# Patient Record
Sex: Female | Born: 1955
Health system: Southern US, Community
[De-identification: ages and names within clinical notes are randomized; demographics above are authoritative.]

## PROBLEM LIST (undated history)

## (undated) DIAGNOSIS — R7303 Prediabetes: Secondary | ICD-10-CM

## (undated) DIAGNOSIS — H6980 Other specified disorders of Eustachian tube, unspecified ear: Secondary | ICD-10-CM

## (undated) DIAGNOSIS — C189 Malignant neoplasm of colon, unspecified: Secondary | ICD-10-CM

## (undated) DIAGNOSIS — T7840XA Allergy, unspecified, initial encounter: Secondary | ICD-10-CM

## (undated) DIAGNOSIS — E039 Hypothyroidism, unspecified: Secondary | ICD-10-CM

## (undated) DIAGNOSIS — E041 Nontoxic single thyroid nodule: Secondary | ICD-10-CM

## (undated) DIAGNOSIS — I82409 Acute embolism and thrombosis of unspecified deep veins of unspecified lower extremity: Secondary | ICD-10-CM

## (undated) DIAGNOSIS — Z0000 Encounter for general adult medical examination without abnormal findings: Secondary | ICD-10-CM

## (undated) DIAGNOSIS — K635 Polyp of colon: Secondary | ICD-10-CM

## (undated) DIAGNOSIS — E781 Pure hyperglyceridemia: Secondary | ICD-10-CM

## (undated) HISTORY — DX: Encounter for general adult medical examination without abnormal findings: Z00.00

## (undated) HISTORY — DX: Hypothyroidism, unspecified: E03.9

## (undated) HISTORY — DX: Acute embolism and thrombosis of unspecified deep veins of unspecified lower extremity: I82.409

## (undated) HISTORY — DX: Pure hyperglyceridemia: E78.1

## (undated) HISTORY — DX: Allergy, unspecified, initial encounter: T78.40XA

## (undated) HISTORY — DX: Polyp of colon: K63.5

## (undated) HISTORY — DX: Prediabetes: R73.03

## (undated) HISTORY — DX: Other specified disorders of Eustachian tube, unspecified ear: H69.80

## (undated) HISTORY — DX: Malignant neoplasm of colon, unspecified: C18.9

## (undated) HISTORY — DX: Nontoxic single thyroid nodule: E04.1

---
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
## 1988-05-21 HISTORY — PX: KNEE ARTHROSCOPY W/ ACL RECONSTRUCTION: SHX1858

## 1995-05-22 DIAGNOSIS — C189 Malignant neoplasm of colon, unspecified: Secondary | ICD-10-CM

## 1995-05-22 HISTORY — PX: COLON RESECTION: SHX5231

## 1995-05-22 HISTORY — DX: Malignant neoplasm of colon, unspecified: C18.9

## 1999-05-22 HISTORY — PX: TUBAL LIGATION: SHX77

## 2000-05-21 HISTORY — PX: HERNIA REPAIR: SHX51

## 2011-03-26 LAB — HM COLONOSCOPY

## 2011-05-22 LAB — HM COLONOSCOPY

## 2011-05-22 LAB — HM MAMMOGRAPHY: HM Mammogram: NEGATIVE

## 2012-02-19 LAB — HM PAP SMEAR: HM PAP: NEGATIVE

## 2012-02-29 DIAGNOSIS — R7303 Prediabetes: Secondary | ICD-10-CM

## 2012-02-29 DIAGNOSIS — E041 Nontoxic single thyroid nodule: Secondary | ICD-10-CM

## 2012-02-29 HISTORY — DX: Prediabetes: R73.03

## 2012-02-29 HISTORY — DX: Nontoxic single thyroid nodule: E04.1

## 2012-07-03 DIAGNOSIS — H6990 Unspecified Eustachian tube disorder, unspecified ear: Secondary | ICD-10-CM

## 2012-07-03 DIAGNOSIS — H6980 Other specified disorders of Eustachian tube, unspecified ear: Secondary | ICD-10-CM

## 2012-07-03 HISTORY — DX: Other specified disorders of Eustachian tube, unspecified ear: H69.80

## 2012-07-03 HISTORY — DX: Unspecified eustachian tube disorder, unspecified ear: H69.90

## 2012-08-23 LAB — HM MAMMOGRAPHY

## 2013-11-12 ENCOUNTER — Encounter: Payer: Self-pay | Admitting: Adult Health

## 2013-11-12 ENCOUNTER — Ambulatory Visit (INDEPENDENT_AMBULATORY_CARE_PROVIDER_SITE_OTHER): Payer: BC Managed Care – PPO | Admitting: Adult Health

## 2013-11-12 ENCOUNTER — Encounter (INDEPENDENT_AMBULATORY_CARE_PROVIDER_SITE_OTHER): Payer: Self-pay

## 2013-11-12 VITALS — BP 108/80 | HR 104 | Temp 98.3°F | Resp 14 | Ht 67.5 in | Wt 174.8 lb

## 2013-11-12 DIAGNOSIS — E781 Pure hyperglyceridemia: Secondary | ICD-10-CM

## 2013-11-12 DIAGNOSIS — E038 Other specified hypothyroidism: Secondary | ICD-10-CM | POA: Insufficient documentation

## 2013-11-12 DIAGNOSIS — Z789 Other specified health status: Secondary | ICD-10-CM

## 2013-11-12 DIAGNOSIS — Z13820 Encounter for screening for osteoporosis: Secondary | ICD-10-CM

## 2013-11-12 DIAGNOSIS — Z1283 Encounter for screening for malignant neoplasm of skin: Secondary | ICD-10-CM

## 2013-11-12 LAB — VITAMIN B12: VITAMIN B 12: 171 pg/mL — AB (ref 211–911)

## 2013-11-12 LAB — TSH: TSH: 0.54 u[IU]/mL (ref 0.35–4.50)

## 2013-11-12 LAB — VITAMIN D 25 HYDROXY (VIT D DEFICIENCY, FRACTURES): VITD: 31.08 ng/mL

## 2013-11-12 NOTE — Progress Notes (Signed)
Patient ID: Davida Falconi, female   DOB: 18-Apr-1956, 58 y.o.   MRN: [id number]   Subjective:    Patient ID: Lendon Ka, female    DOB: December 05, 1955, 58 y.o.   MRN: [id number]  HPI  Pt is a pleasant 58 y/o female who presents to clinic to establish care. She moved here from [place] in April. History of colon cancer s/p resection and chemotherapy (1997) at age 15. She had yearly colonoscopies for several years. Last colonoscopy was 2012 and advised to return to normal schedule of 10 years.  She has mammograms every 2 years. Last mammogram (02/2012). Last PAP and HPV were normal (03/06/12).  Pt has an itchy area on the center of her back that she would like evaluated. She reports years of sun exposure. Grew up in era with not sun protection. Has never had a full skin assessment.   Past Medical History  Diagnosis Date  . Colon cancer 1997    s/p resection, chemo  . DVT (deep venous thrombosis)     2/2 BCP  . Hypothyroidism     On Armour. Did not tolerate synthroid     Past Surgical History  Procedure Laterality Date  . Tubal ligation  2001  . Knee arthroscopy w/ acl reconstruction Left 1990  . Colon resection  1997  . Tonsillectomy and adenoidectomy  1962     Family History  Problem Relation Age of Onset  . Hyperlipidemia Brother      History   Social History  . Marital Status: Single    Spouse Name: N/A    Number of Children: 0  . Years of Education: 73   Occupational History  . Sales & Marketing     Retired   Social History Main Topics  . Smoking status: Never Smoker   . Smokeless tobacco: Never Used  . Alcohol Use: Yes     Comment: 1 drink weekly  . Drug Use: No  . Sexual Activity: Not on file   Other Topics Concern  . Not on file   Social History Narrative   Living in [place]. Divorced. Moved from [place] to this area in April 2015. She has an Loss adjuster, chartered. Retired from Tour manager.      Hobbies: cross country ski, gardening, travel, reading,  sewing   Caffeine: tea 2 cups daily   Exercise: [employer]. Workouts 4x/week - cardio, weight training   Diet: pescetarian    Review of Systems  Constitutional: Negative.   HENT: Negative.   Eyes: Negative.   Respiratory: Negative.   Cardiovascular: Negative.   Gastrointestinal: Negative.   Endocrine: Negative.   Genitourinary: Negative.   Musculoskeletal: Negative.   Skin:       Area on the center of upper back that is very itchy.  Allergic/Immunologic: Negative.   Neurological: Negative.   Hematological: Negative.   Psychiatric/Behavioral: Negative.        Objective:  BP 108/80  Pulse 104  Temp(Src) 98.3 F (36.8 C) (Oral)  Resp 14  Ht 5' 7.5" (1.715 m)  Wt 174 lb 12 oz (79.266 kg)  BMI 26.95 kg/m2  SpO2 98%   Physical Exam  Constitutional: She is oriented to person, place, and time. No distress.  HENT:  Head: Normocephalic and atraumatic.  Eyes: Conjunctivae and EOM are normal.  Neck: Normal range of motion. Neck supple.  Cardiovascular: Normal rate, regular rhythm, normal heart sounds and intact distal pulses.  Exam reveals no gallop and no friction rub.   No  murmur heard. Pulmonary/Chest: Effort normal and breath sounds normal. No respiratory distress. She has no wheezes. She has no rales.  Musculoskeletal: Normal range of motion.  Neurological: She is alert and oriented to person, place, and time. She has normal reflexes. Coordination normal.  Skin: Skin is warm and dry.  comedone - center of back. Several dark nevi. Hx of sun exposure without use of sunblock.  Psychiatric: She has a normal mood and affect. Her behavior is normal. Judgment and thought content normal.      Assessment & Plan:   1. Other specified hypothyroidism Check level. Refill Armour 30 mg daily. Follow - TSH  2. Hypertriglyceridemia w/o hypercholesterolemia Check labs. Hx of hypertriglyceridemia. Follow. - Lipid panel; Future  3. Screening for osteoporosis Ordered Dexa scan - she  would like this done in August. Check vitamin D level. - Vit D  25 hydroxy (rtn osteoporosis monitoring) - DG Bone Density; Future  4. Vegetarian diet Will check B12 level. Pesceterian. Only has fish maybe once weekly. She does eats eaggs - Vitamin B12  5. Screening for skin cancer Removal of comedone which was causing itching. Refer to derm for full skin assessment. She has several nevi that are very dark in color. Hx of sun exposure with no use of sunblock. - Ambulatory referral to Dermatology

## 2013-11-12 NOTE — Patient Instructions (Signed)
   Thank you for choosing [place] [facility] for your health care needs.  Please have your labs drawn prior to leaving the office.  The results will be available through MyChart for your convenience. Please remember to activate this. The activation code is located at the end of this form. If this has not been activated, we will call you with the results once I have reviewed them.  Prescription for Armour 30 mg daily #90, refill 3. If you need me to send this to Mail Order Pharmacy please call the office and advise of where to send.  I am referring you to [facility]. The office will contact you with an appointment.  I am ordering a Dexa scan. Will try to get this scheduled the first week of August per your request.  Please call with any questions or concerns.

## 2013-11-12 NOTE — Progress Notes (Signed)
Pre visit review using our clinic review tool, if applicable. No additional management support is needed unless otherwise documented below in the visit note. 

## 2013-11-13 ENCOUNTER — Encounter: Payer: Self-pay | Admitting: Adult Health

## 2013-11-13 ENCOUNTER — Encounter: Payer: Self-pay | Admitting: *Deleted

## 2013-11-13 MED ORDER — THYROID 30 MG PO TABS: 30.0000 mg | ORAL_TABLET | Freq: Every day | ORAL | Status: DC

## 2013-11-15 ENCOUNTER — Encounter: Payer: Self-pay | Admitting: Adult Health

## 2013-11-16 ENCOUNTER — Ambulatory Visit (INDEPENDENT_AMBULATORY_CARE_PROVIDER_SITE_OTHER): Payer: BC Managed Care – PPO | Admitting: *Deleted

## 2013-11-16 ENCOUNTER — Encounter: Payer: Self-pay | Admitting: Adult Health

## 2013-11-16 DIAGNOSIS — E538 Deficiency of other specified B group vitamins: Secondary | ICD-10-CM

## 2013-11-16 MED ORDER — CYANOCOBALAMIN 1000 MCG/ML IJ SOLN
1000.0000 ug | Freq: Once | INTRAMUSCULAR | Status: AC
  Administered 2013-11-16: 1000 ug via INTRAMUSCULAR

## 2013-11-16 NOTE — Telephone Encounter (Signed)
Pt id read mychart message, b12 appt scheduled for today.

## 2013-11-18 ENCOUNTER — Telehealth: Payer: Self-pay | Admitting: *Deleted

## 2013-11-18 ENCOUNTER — Ambulatory Visit (INDEPENDENT_AMBULATORY_CARE_PROVIDER_SITE_OTHER): Payer: BC Managed Care – PPO | Admitting: *Deleted

## 2013-11-18 DIAGNOSIS — E538 Deficiency of other specified B group vitamins: Secondary | ICD-10-CM

## 2013-11-18 MED ORDER — CYANOCOBALAMIN 1000 MCG/ML IJ SOLN
1000.0000 ug | Freq: Once | INTRAMUSCULAR | Status: AC
  Administered 2013-11-18: 1000 ug via INTRAMUSCULAR

## 2013-11-18 NOTE — Telephone Encounter (Signed)
Pt in for her B12 injection today. States she saw B12 patches at the pharmacy. Are you familiar with those and would recommend?

## 2013-11-19 NOTE — Telephone Encounter (Signed)
Called patient, no answer. Will try again later.

## 2013-11-19 NOTE — Telephone Encounter (Signed)
I would recommend she continue injections. Once we get her level up, she can then take over the counter pills. I have not heard of the B12 patches and their effectiveness.

## 2013-12-03 NOTE — Telephone Encounter (Signed)
Sent my chart message with information.

## 2013-12-22 ENCOUNTER — Ambulatory Visit: Payer: BC Managed Care – PPO

## 2013-12-22 ENCOUNTER — Ambulatory Visit (INDEPENDENT_AMBULATORY_CARE_PROVIDER_SITE_OTHER): Payer: BC Managed Care – PPO | Admitting: *Deleted

## 2013-12-22 DIAGNOSIS — E538 Deficiency of other specified B group vitamins: Secondary | ICD-10-CM

## 2013-12-22 MED ORDER — CYANOCOBALAMIN 1000 MCG/ML IJ SOLN
1000.0000 ug | Freq: Once | INTRAMUSCULAR | Status: AC
  Administered 2013-12-22: 1000 ug via INTRAMUSCULAR

## 2013-12-29 ENCOUNTER — Ambulatory Visit (INDEPENDENT_AMBULATORY_CARE_PROVIDER_SITE_OTHER): Payer: BC Managed Care – PPO | Admitting: *Deleted

## 2013-12-29 DIAGNOSIS — E538 Deficiency of other specified B group vitamins: Secondary | ICD-10-CM

## 2013-12-29 MED ORDER — CYANOCOBALAMIN 1000 MCG/ML IJ SOLN
1000.0000 ug | Freq: Once | INTRAMUSCULAR | Status: AC
  Administered 2013-12-29: 1000 ug via INTRAMUSCULAR

## 2013-12-30 ENCOUNTER — Ambulatory Visit (INDEPENDENT_AMBULATORY_CARE_PROVIDER_SITE_OTHER): Payer: BC Managed Care – PPO | Admitting: Internal Medicine

## 2013-12-30 ENCOUNTER — Encounter: Payer: Self-pay | Admitting: Internal Medicine

## 2013-12-30 VITALS — BP 138/98 | HR 105 | Temp 98.3°F | Resp 18 | Ht 67.5 in | Wt 177.8 lb

## 2013-12-30 DIAGNOSIS — K909 Intestinal malabsorption, unspecified: Secondary | ICD-10-CM

## 2013-12-30 DIAGNOSIS — E781 Pure hyperglyceridemia: Secondary | ICD-10-CM

## 2013-12-30 DIAGNOSIS — E038 Other specified hypothyroidism: Secondary | ICD-10-CM

## 2013-12-30 DIAGNOSIS — K9089 Other intestinal malabsorption: Secondary | ICD-10-CM

## 2013-12-30 DIAGNOSIS — Z13820 Encounter for screening for osteoporosis: Secondary | ICD-10-CM

## 2013-12-30 DIAGNOSIS — E039 Hypothyroidism, unspecified: Secondary | ICD-10-CM

## 2013-12-30 NOTE — Patient Instructions (Signed)
Repeating thyroid studies today or with fasting lipids  Try Joseph's flat bread and pita bread  For low carb bread choices  The B12 is best supplemented with the quick dissolve or sublingual tablets.   I recommend getting the majority of your calcium and Vitamin D  through diet rather than supplements given the recent association of calcium supplements with increased coronary artery calcium scores (You need 1200 mg daily )   Unsweetened almond/coconut milk is a great low calorie low carb, cholesterol free  way to increase your dietary calcium and vitamin D.  Try the blue Jackquline Bosch

## 2013-12-30 NOTE — Progress Notes (Signed)
Pre-visit discussion using our clinic review tool. No additional management support is needed unless otherwise documented below in the visit note.  

## 2013-12-30 NOTE — Progress Notes (Signed)
Patient ID: Carolyn Bass, female   DOB: Jun 09, 1955, 58 y.o.   MRN: [id number]  Patient Active Problem List   Diagnosis Date Noted  . Steatorrhea 12/31/2013  . Other specified hypothyroidism 11/12/2013  . Screening for osteoporosis 11/12/2013  . Vegetarian diet 11/12/2013  . Screening for skin cancer 11/12/2013  . Hypertriglyceridemia without hypercholesterolemia 11/12/2013    Subjective:  CC:   Chief Complaint  Patient presents with  . Follow-up    talk about nutrition    HPI:   Carolyn Bass is a 58 y.o. female who presents for Follow up on multiple issues.  iatrogenic malabsorption secondary to History of colon resection in 1998 for stage 3 colon CA.    History of resection of 1/3 of large intestine and 12 inches of small intestine complicated by a surgical hernia with mesh.  Has had   chronic diarrhea originally  treated with opiates.  Transitioned to immodium,  But continues to have loose stools post prandially and .  Self diagnosed steatorrhea and bile acid diarrhea..  Complicated by vegetarian lifestyle and b12 deficiency.  No prior trial of Questran.   Thyroid problems.  Has been taking Armour thyroid for years,  Previously on a compounded Armour product no longer available.  Prior trial of levothyroxine years ago not tolerated due to developmentent of brittle nails,  hair loss and excessive fatigue.  Recent TSH was 0.6  No tremor or unintentional wt loss.  Does note exercise intolerance which has become chronic , formerly very physically active .    Past Medical History  Diagnosis Date  . Colon cancer 1997    s/p resection, chemo  . DVT (deep venous thrombosis)     2/2 BCP  . Hypothyroidism     On Armour. Did not tolerate synthroid  . Prediabetes 02/29/12  . Thyroid nodule 02/29/12  . Eustachian tube dysfunction 07/03/12  . Hypertriglyceridemia without hypercholesterolemia     Past Surgical History  Procedure Laterality Date  . Tubal ligation  2001  . Knee  arthroscopy w/ acl reconstruction Left 1990  . Colon resection  1997  . Tonsillectomy and adenoidectomy  1962  . Hernia repair  2002       The following portions of the patient's history were reviewed and updated as appropriate: Allergies, current medications, and problem list.    Review of Systems:   Patient denies headache, fevers, malaise, unintentional weight loss, skin rash, eye pain, sinus congestion and sinus pain, sore throat, dysphagia,  hemoptysis , cough, dyspnea, wheezing, chest pain, palpitations, orthopnea, edema, abdominal pain, nausea, melena, diarrhea, constipation, flank pain, dysuria, hematuria, urinary  Frequency, nocturia, numbness, tingling, seizures,  Focal weakness, Loss of consciousness,  Tremor, insomnia, depression, anxiety, and suicidal ideation.     History   Social History  . Marital Status: Single    Spouse Name: N/A    Number of Children: 0  . Years of Education: 27   Occupational History  . Sales & Marketing     Retired   Social History Main Topics  . Smoking status: Never Smoker   . Smokeless tobacco: Never Used  . Alcohol Use: Yes     Comment: 1 drink weekly  . Drug Use: No  . Sexual Activity: Not on file   Other Topics Concern  . Not on file   Social History Narrative   Living in [place]. Divorced. Moved from [place] to this area in April 2015. She has an Loss adjuster, chartered. Retired from Tour manager.  Hobbies: cross country ski, gardening, travel, reading, sewing   Caffeine: tea 2 cups daily   Exercise: [employer]. Workouts 4x/week - cardio, weight training   Diet: pescetarian    Objective:  Filed Vitals:   12/30/13 1559  BP: 138/98  Pulse: 105  Temp: 98.3 F (36.8 C)  Resp: 18     General appearance: alert, cooperative and appears stated age Ears: normal TM's and external ear canals both ears Throat: lips, mucosa, and tongue normal; teeth and gums normal Neck: no adenopathy, no carotid bruit, supple, symmetrical,  trachea midline and thyroid not enlarged, symmetric, no tenderness/mass/nodules Back: symmetric, no curvature. ROM normal. No CVA tenderness. Lungs: clear to auscultation bilaterally Heart: regular rate and rhythm, S1, S2 normal, no murmur, click, rub or gallop Abdomen: soft, non-tender; bowel sounds normal; no masses,  no organomegaly Pulses: 2+ and symmetric Skin: Skin color, texture, turgor normal. No rashes or lesions Lymph nodes: Cervical, supraclavicular, and axillary nodes normal.  Assessment and Plan:  Steatorrhea Iatrogenic, secondary to bowel resection.  Trial of Questran.   Hypertriglyceridemia without hypercholesterolemia Will return for fasting lipids.   Other specified hypothyroidism Will repeat TSH with Free T4 and adjust Armour to achieve TSH 1.0  Screening for osteoporosis She is at increased risk fdue to malabsorption.  DEXA has been ordered.  Discussed the current controversies surrounding the risks and benefits of calcium supplementation.  Encouraged her to increase dietary calcium through natural foods including almond/coconut milk  A total of 40 minutes was spent with patient more than half of which was spent in counseling patient on the above mentioned issues , reviewing and explaining recent labs ,  and coordination of care.   Updated Medication List Outpatient Encounter Prescriptions as of 12/30/2013  Medication Sig  . thyroid (ARMOUR) 30 MG tablet Take 1 tablet (30 mg total) by mouth daily before breakfast.  . cholestyramine light (PREVALITE) 4 GM/DOSE powder Take 1 packet (4 g total) by mouth 3 (three) times daily with meals.  . Nutritional Supplements (ESTROVEN PO) Take 1 tablet by mouth 2 (two) times daily.     Orders Placed This Encounter  Procedures  . T4 AND TSH    No Follow-up on file.

## 2013-12-31 ENCOUNTER — Encounter: Payer: Self-pay | Admitting: Adult Health

## 2013-12-31 ENCOUNTER — Other Ambulatory Visit: Payer: Self-pay | Admitting: *Deleted

## 2013-12-31 ENCOUNTER — Other Ambulatory Visit (INDEPENDENT_AMBULATORY_CARE_PROVIDER_SITE_OTHER): Payer: BC Managed Care – PPO

## 2013-12-31 DIAGNOSIS — K909 Intestinal malabsorption, unspecified: Secondary | ICD-10-CM

## 2013-12-31 DIAGNOSIS — E781 Pure hyperglyceridemia: Secondary | ICD-10-CM

## 2013-12-31 DIAGNOSIS — E039 Hypothyroidism, unspecified: Secondary | ICD-10-CM

## 2013-12-31 LAB — LIPID PANEL
Cholesterol: 152 mg/dL (ref 0–200)
HDL: 44 mg/dL (ref >39.00)
LDL CALC: 68 mg/dL (ref 0–99)
NonHDL: 108
TRIGLYCERIDES: 199 mg/dL — AB (ref 0.0–149.0)
Total CHOL/HDL Ratio: 3
VLDL: 39.8 mg/dL (ref 0.0–40.0)

## 2013-12-31 MED ORDER — CHOLESTYRAMINE LIGHT 4 GM/DOSE PO POWD: 4.0000 g | Freq: Three times a day (TID) | ORAL | Status: DC

## 2013-12-31 NOTE — Assessment & Plan Note (Signed)
Will repeat TSH with Free T4 and adjust Armour to achieve TSH 1.0

## 2013-12-31 NOTE — Assessment & Plan Note (Signed)
Will return for fasting lipids.

## 2013-12-31 NOTE — Assessment & Plan Note (Signed)
Iatrogenic, secondary to bowel resection.  Trial of Questran.

## 2013-12-31 NOTE — Assessment & Plan Note (Signed)
She is at increased risk fdue to malabsorption.  DEXA has been ordered.  Discussed the current controversies surrounding the risks and benefits of calcium supplementation.  Encouraged her to increase dietary calcium through natural foods including almond/coconut milk

## 2014-01-01 LAB — T4 AND TSH
T4, Total: 7.9 ug/dL (ref 4.5–12.0)
TSH: 1.99 u[IU]/mL (ref 0.450–4.500)

## 2014-01-02 ENCOUNTER — Encounter: Payer: Self-pay | Admitting: Internal Medicine

## 2014-01-05 ENCOUNTER — Ambulatory Visit (INDEPENDENT_AMBULATORY_CARE_PROVIDER_SITE_OTHER): Payer: BC Managed Care – PPO | Admitting: *Deleted

## 2014-01-05 DIAGNOSIS — E538 Deficiency of other specified B group vitamins: Secondary | ICD-10-CM

## 2014-01-05 MED ORDER — CYANOCOBALAMIN 1000 MCG/ML IJ SOLN
1000.0000 ug | Freq: Once | INTRAMUSCULAR | Status: AC
  Administered 2014-01-05: 1000 ug via INTRAMUSCULAR

## 2014-02-09 ENCOUNTER — Ambulatory Visit (INDEPENDENT_AMBULATORY_CARE_PROVIDER_SITE_OTHER): Payer: BC Managed Care – PPO

## 2014-02-09 DIAGNOSIS — E538 Deficiency of other specified B group vitamins: Secondary | ICD-10-CM

## 2014-02-09 MED ORDER — CYANOCOBALAMIN 1000 MCG/ML IJ SOLN
1000.0000 ug | Freq: Once | INTRAMUSCULAR | Status: AC
  Administered 2014-02-09: 1000 ug via INTRAMUSCULAR

## 2014-04-23 ENCOUNTER — Encounter: Payer: Self-pay | Admitting: Internal Medicine

## 2014-04-23 ENCOUNTER — Ambulatory Visit (INDEPENDENT_AMBULATORY_CARE_PROVIDER_SITE_OTHER): Payer: BC Managed Care – PPO | Admitting: Internal Medicine

## 2014-04-23 VITALS — BP 138/84 | HR 98 | Temp 98.6°F | Resp 18 | Wt 172.5 lb

## 2014-04-23 DIAGNOSIS — J208 Acute bronchitis due to other specified organisms: Secondary | ICD-10-CM

## 2014-04-23 MED ORDER — ALBUTEROL SULFATE HFA 108 (90 BASE) MCG/ACT IN AERS
2.0000 | INHALATION_SPRAY | Freq: Four times a day (QID) | RESPIRATORY_TRACT | Status: DC | PRN
Start: 2014-04-23 — End: 2014-12-28

## 2014-04-23 MED ORDER — ACETAMINOPHEN-CODEINE #3 300-30 MG PO TABS: 1.0000 | ORAL_TABLET | ORAL | Status: DC | PRN

## 2014-04-23 MED ORDER — LEVOFLOXACIN 500 MG PO TABS: 500.0000 mg | ORAL_TABLET | Freq: Every day | ORAL | Status: DC

## 2014-04-23 MED ORDER — PREDNISONE (PAK) 10 MG PO TABS: ORAL_TABLET | ORAL | Status: DC

## 2014-04-23 NOTE — Progress Notes (Signed)
Patient ID: Carolyn Bass, female   DOB: [birth year], 58 y.o.   MRN: [id number]    Patient Active Problem List   Diagnosis Date Noted  . Acute bronchitis 04/24/2014  . Steatorrhea 12/31/2013  . Other specified hypothyroidism 11/12/2013  . Screening for osteoporosis 11/12/2013  . Vegetarian diet 11/12/2013  . Screening for skin cancer 11/12/2013  . Hypertriglyceridemia without hypercholesterolemia 11/12/2013    Subjective:  CC:   Chief Complaint  Patient presents with  . Cough    X 3 weeks, first week cough with yellow mucus was prescribed kelflex 500mg  for tooth extraction just finishe 10 day course on 12/  . Fever    Low grade fevr . still waking with sweats, and a terrible cough.    HPI:   Carolyn Bass is a 58 y.o. female who presents for  Persistent nonproductive cough which started 3 weeks ago .  Symptoms started three weeks ago  After having several sick contacts including an aunt with pneumonia.  Cough started with some mucus production.  Started taking keflex 500 mg qid 10 days ago had it for tooth extraction) prescribed by out of town MD in [place] when she developed  fevers to 101.  Fevers and sputum have resolved but still coughing paroxysmally  And still having  night sweats   Has had TDap vaccine  ']    Past Medical History  Diagnosis Date  . Colon cancer 1997    s/p resection, chemo  . DVT (deep venous thrombosis)     2/2 BCP  . Hypothyroidism     On Armour. Did not tolerate synthroid  . Prediabetes 02/29/12  . Thyroid nodule 02/29/12  . Eustachian tube dysfunction 07/03/12  . Hypertriglyceridemia without hypercholesterolemia     Past Surgical History  Procedure Laterality Date  . Tubal ligation  2001  . Knee arthroscopy w/ acl reconstruction Left 1990  . Colon resection  1997  . Tonsillectomy and adenoidectomy  1962  . Hernia repair  2002       The following portions of the patient's history were reviewed and updated as appropriate:  Allergies, current medications, and problem list.    Review of Systems:   Patient denies headache, fevers, malaise, unintentional weight loss, skin rash, eye pain, sinus congestion and sinus pain, sore throat, dysphagia,  hemoptysis , cough, dyspnea, wheezing, chest pain, palpitations, orthopnea, edema, abdominal pain, nausea, melena, diarrhea, constipation, flank pain, dysuria, hematuria, urinary  Frequency, nocturia, numbness, tingling, seizures,  Focal weakness, Loss of consciousness,  Tremor, insomnia, depression, anxiety, and suicidal ideation.     History   Social History  . Marital Status: Single    Spouse Name: N/A    Number of Children: 0  . Years of Education: 41   Occupational History  . Sales & Marketing     Retired   Social History Main Topics  . Smoking status: Never Smoker   . Smokeless tobacco: Never Used  . Alcohol Use: Yes     Comment: 1 drink weekly  . Drug Use: No  . Sexual Activity: Not on file   Other Topics Concern  . Not on file   Social History Narrative   Living in [place]. Divorced. Moved from [place] to this area in April 2015. She has an Loss adjuster, chartered. Retired from Tour manager.      Hobbies: cross country ski, gardening, travel, reading, sewing   Caffeine: tea 2 cups daily   Exercise: [employer]. Workouts 4x/week - cardio, weight  training   Diet: pescetarian    Objective:  Filed Vitals:   04/23/14 0857  BP: 138/84  Pulse: 98  Temp: 98.6 F (37 C)  Resp: 18     General appearance: alert, cooperative and appears stated age Ears: normal TM's and external ear canals both ears Throat: lips, mucosa, and tongue normal; teeth and gums normal Neck: no adenopathy, no carotid bruit, supple, symmetrical, trachea midline and thyroid not enlarged, symmetric, no tenderness/mass/nodules Back: symmetric, no curvature. ROM normal. No CVA tenderness. Lungs: clear to auscultation bilaterally Heart: regular rate and rhythm, S1, S2 normal, no  murmur, click, rub or gallop Abdomen: soft, non-tender; bowel sounds normal; no masses,  no organomegaly Pulses: 2+ and symmetric Skin: Skin color, texture, turgor normal. No rashes or lesions Lymph nodes: Cervical, supraclavicular, and axillary nodes normal.  Assessment and Plan:  Acute bronchitis Treating with steroids and inhaled  B/d therapy. Adding levaquin if no improvement in 3-4 days.    Updated Medication List Outpatient Encounter Prescriptions as of 04/23/2014  Medication Sig  . cholestyramine light (PREVALITE) 4 GM/DOSE powder Take 1 packet (4 g total) by mouth 3 (three) times daily with meals.  . Nutritional Supplements (ESTROVEN PO) Take 1 tablet by mouth 2 (two) times daily.  Marland Kitchen thyroid (ARMOUR) 30 MG tablet Take 1 tablet (30 mg total) by mouth daily before breakfast.  . acetaminophen-codeine (TYLENOL #3) 300-30 MG per tablet Take 1 tablet by mouth every 4 (four) hours as needed for moderate pain (cough).  Marland Kitchen albuterol (PROVENTIL HFA;VENTOLIN HFA) 108 (90 BASE) MCG/ACT inhaler Inhale 2 puffs into the lungs every 6 (six) hours as needed for wheezing or shortness of breath.  . levofloxacin (LEVAQUIN) 500 MG tablet Take 1 tablet (500 mg total) by mouth daily.  . predniSONE (STERAPRED UNI-PAK) 10 MG tablet 6 tablets on Day 1 , then reduce by 1 tablet daily until gone     No orders of the defined types were placed in this encounter.    No Follow-up on file.

## 2014-04-23 NOTE — Progress Notes (Signed)
Pre-visit discussion using our clinic review tool. No additional management support is needed unless otherwise documented below in the visit note.  

## 2014-04-23 NOTE — Patient Instructions (Signed)
You have a viral infection complicated by bronchitis.  I am prescribing a cough suppressant in pill form to manage your cough:   tylenol with codeine    Along with an albuterol  inhaler to use every 6 hours to help dilate your airways,  And a 6 day prednisone taper to decrease the inflammation    I also advise use of the following OTC meds to help with your other symptoms.   Take generic OTC benadryl 25 mg every 8 hours for  Sinus drainage,  Sudafed PE  10 to 30 mg every 8 hours for the congestion, you may substitute Afrin nasal spray for the nighttime dose of sudafed PE  If needed to prevent insomnia.  flush your sinuses twice daily with Simply Saline (do over the sink because if you do it right you will spit out globs of mucus)  Gargle with salt water as needed for sore throat.   Add the levaquin if you do not feel better in 3 days   Please take a probiotic ( Align, Floraque or Culturelle) while you are on the antibiotic to prevent a serious antibiotic associated diarrhea  Called clostridium dificile colitis and a vaginal yeast infection .

## 2014-04-24 DIAGNOSIS — J209 Acute bronchitis, unspecified: Secondary | ICD-10-CM | POA: Insufficient documentation

## 2014-04-24 NOTE — Assessment & Plan Note (Signed)
Treating with steroids and inhaled  B/d therapy. Adding levaquin if no improvement in 3-4 days.

## 2014-10-25 ENCOUNTER — Encounter: Payer: Self-pay | Admitting: Internal Medicine

## 2014-10-26 ENCOUNTER — Other Ambulatory Visit: Payer: Self-pay | Admitting: *Deleted

## 2014-10-26 MED ORDER — THYROID 30 MG PO TABS: 30.0000 mg | ORAL_TABLET | Freq: Every day | ORAL | Status: DC

## 2014-11-08 ENCOUNTER — Encounter: Payer: Self-pay | Admitting: Internal Medicine

## 2014-11-10 ENCOUNTER — Other Ambulatory Visit: Payer: Self-pay | Admitting: *Deleted

## 2014-11-10 MED ORDER — THYROID 30 MG PO TABS: 30.0000 mg | ORAL_TABLET | Freq: Every day | ORAL | Status: DC

## 2014-11-28 ENCOUNTER — Other Ambulatory Visit: Payer: Self-pay | Admitting: Nurse Practitioner

## 2014-12-20 ENCOUNTER — Encounter: Payer: Self-pay | Admitting: Nurse Practitioner

## 2014-12-20 ENCOUNTER — Ambulatory Visit (INDEPENDENT_AMBULATORY_CARE_PROVIDER_SITE_OTHER): Payer: BLUE CROSS/BLUE SHIELD | Admitting: Nurse Practitioner

## 2014-12-20 VITALS — BP 116/68 | HR 96 | Temp 98.4°F | Resp 16 | Ht 68.0 in | Wt 173.0 lb

## 2014-12-20 DIAGNOSIS — E538 Deficiency of other specified B group vitamins: Secondary | ICD-10-CM | POA: Insufficient documentation

## 2014-12-20 DIAGNOSIS — E039 Hypothyroidism, unspecified: Secondary | ICD-10-CM | POA: Diagnosis not present

## 2014-12-20 DIAGNOSIS — Z1283 Encounter for screening for malignant neoplasm of skin: Secondary | ICD-10-CM | POA: Diagnosis not present

## 2014-12-20 LAB — T4, FREE: Free T4: 0.93 ng/dL (ref 0.60–1.60)

## 2014-12-20 LAB — VITAMIN B12: VITAMIN B 12: 480 pg/mL (ref 211–911)

## 2014-12-20 LAB — TSH: TSH: 0.97 u[IU]/mL (ref 0.35–4.50)

## 2014-12-20 NOTE — Assessment & Plan Note (Signed)
Patient had parenteral B12 last year and followed with oral supplementation. Pt not recently taken oral B12 and would like to follow up on her level. Denies symptoms today. Will follow

## 2014-12-20 NOTE — Patient Instructions (Signed)
Please visit the lab before leaving today.   If your levels are normal we will send in refills for 1 year of the thyroid medication

## 2014-12-20 NOTE — Progress Notes (Signed)
   Subjective:    Patient ID: Carolyn Bass, female    DOB: 1955-11-20, 59 y.o.   MRN: [id number]  HPI  Carolyn Bass is a 59 yo female with a CC of thyroid medication and B12 levels.  1) B12 deficiency- Denies numbness/tingling in toes or fingers   2) Thyroid- [facility] mail lost the box and the pharmacy received her medication. She is taking consistently  3) Dermatology- last fall skin cancer check   Review of Systems  Constitutional: Negative for fever, chills, diaphoresis and fatigue.  HENT: Negative for trouble swallowing and voice change.   Respiratory: Negative for chest tightness, shortness of breath and wheezing.   Cardiovascular: Negative for chest pain, palpitations and leg swelling.  Gastrointestinal: Negative for nausea, vomiting and diarrhea.  Skin: Negative for rash.  Neurological: Negative for dizziness, weakness, numbness and headaches.  Psychiatric/Behavioral: The patient is not nervous/anxious.       Objective:   Physical Exam  Constitutional: She is oriented to person, place, and time. She appears well-developed and well-nourished. No distress.  BP 116/68 mmHg  Pulse 96  Temp(Src) 98.4 F (36.9 C)  Resp 16  Ht 5\' 8"  (1.727 m)  Wt 173 lb (78.472 kg)  BMI 26.31 kg/m2  SpO2 97%   HENT:  Head: Normocephalic and atraumatic.  Right Ear: External ear normal.  Left Ear: External ear normal.  Cardiovascular: Normal rate, regular rhythm and normal heart sounds.   Pulmonary/Chest: Effort normal and breath sounds normal. No respiratory distress. She has no wheezes. She has no rales. She exhibits no tenderness.  Neurological: She is alert and oriented to person, place, and time. No cranial nerve deficit. She exhibits normal muscle tone. Coordination normal.  Skin: Skin is warm and dry. No rash noted. She is not diaphoretic.  Psychiatric: She has a normal mood and affect. Her behavior is normal. Judgment and thought content normal.      Assessment & Plan:

## 2014-12-20 NOTE — Assessment & Plan Note (Signed)
Hypothyroid- pt stable on Armour 30 mg. Will follow after checking TSH and T4. If normal- send year supply into [facility]. If abnormal follow.

## 2014-12-20 NOTE — Assessment & Plan Note (Signed)
Patient saw Dermatology in Fall 2015 and had a normal skin cancer full body screening. They told the pt to follow up only if something is abnormal.

## 2014-12-20 NOTE — Progress Notes (Signed)
Pre visit review using our clinic review tool, if applicable. No additional management support is needed unless otherwise documented below in the visit note. 

## 2014-12-21 ENCOUNTER — Other Ambulatory Visit: Payer: Self-pay | Admitting: Nurse Practitioner

## 2014-12-21 MED ORDER — THYROID 30 MG PO TABS: 30.0000 mg | ORAL_TABLET | Freq: Every day | ORAL | Status: DC

## 2014-12-24 ENCOUNTER — Telehealth: Payer: Self-pay | Admitting: *Deleted

## 2014-12-24 ENCOUNTER — Other Ambulatory Visit: Payer: Self-pay | Admitting: Nurse Practitioner

## 2014-12-24 DIAGNOSIS — Z0000 Encounter for general adult medical examination without abnormal findings: Secondary | ICD-10-CM

## 2014-12-24 NOTE — Telephone Encounter (Signed)
Pt coming in Monday for fasting labs. Need orders

## 2014-12-24 NOTE — Telephone Encounter (Signed)
I put in the orders.  Thanks.  ?

## 2014-12-27 ENCOUNTER — Other Ambulatory Visit (INDEPENDENT_AMBULATORY_CARE_PROVIDER_SITE_OTHER): Payer: BLUE CROSS/BLUE SHIELD

## 2014-12-27 DIAGNOSIS — E781 Pure hyperglyceridemia: Secondary | ICD-10-CM | POA: Diagnosis not present

## 2014-12-27 DIAGNOSIS — E038 Other specified hypothyroidism: Secondary | ICD-10-CM | POA: Diagnosis not present

## 2014-12-27 DIAGNOSIS — Z0000 Encounter for general adult medical examination without abnormal findings: Secondary | ICD-10-CM | POA: Diagnosis not present

## 2014-12-27 LAB — CBC WITH DIFFERENTIAL/PLATELET
Basophils Absolute: 0 10*3/uL (ref 0.0–0.1)
Basophils Relative: 0.2 % (ref 0.0–3.0)
Eosinophils Absolute: 0 10*3/uL (ref 0.0–0.7)
Eosinophils Relative: 0.8 % (ref 0.0–5.0)
HCT: 44.2 % (ref 36.0–46.0)
HEMOGLOBIN: 14.6 g/dL (ref 12.0–15.0)
LYMPHS ABS: 1.5 10*3/uL (ref 0.7–4.0)
LYMPHS PCT: 25.2 % (ref 12.0–46.0)
MCHC: 33 g/dL (ref 30.0–36.0)
MCV: 87.3 fl (ref 78.0–100.0)
Monocytes Absolute: 0.3 10*3/uL (ref 0.1–1.0)
Monocytes Relative: 5 % (ref 3.0–12.0)
Neutro Abs: 4.2 10*3/uL (ref 1.4–7.7)
Neutrophils Relative %: 68.8 % (ref 43.0–77.0)
PLATELETS: 156 10*3/uL (ref 150.0–400.0)
RBC: 5.06 Mil/uL (ref 3.87–5.11)
RDW: 13.4 % (ref 11.5–15.5)
WBC: 6.2 10*3/uL (ref 4.0–10.5)

## 2014-12-27 LAB — TSH: TSH: 2.16 u[IU]/mL (ref 0.35–4.50)

## 2014-12-27 LAB — LIPID PANEL
CHOL/HDL RATIO: 4
CHOLESTEROL: 174 mg/dL (ref 0–200)
HDL: 49.5 mg/dL (ref >39.00)
NonHDL: 124.71
TRIGLYCERIDES: 316 mg/dL — AB (ref 0.0–149.0)
VLDL: 63.2 mg/dL — AB (ref 0.0–40.0)

## 2014-12-27 LAB — COMPREHENSIVE METABOLIC PANEL
ALK PHOS: 64 U/L (ref 39–117)
ALT: 19 U/L (ref 0–35)
AST: 19 U/L (ref 0–37)
Albumin: 4.4 g/dL (ref 3.5–5.2)
BUN: 11 mg/dL (ref 6–23)
CHLORIDE: 103 meq/L (ref 96–112)
CO2: 27 mEq/L (ref 19–32)
CREATININE: 0.73 mg/dL (ref 0.40–1.20)
Calcium: 9.4 mg/dL (ref 8.4–10.5)
GFR: 86.75 mL/min (ref >60.00)
Glucose, Bld: 92 mg/dL (ref 70–99)
POTASSIUM: 4.3 meq/L (ref 3.5–5.1)
Sodium: 139 mEq/L (ref 135–145)
Total Bilirubin: 0.9 mg/dL (ref 0.2–1.2)
Total Protein: 6.8 g/dL (ref 6.0–8.3)

## 2014-12-27 LAB — LDL CHOLESTEROL, DIRECT: Direct LDL: 82 mg/dL

## 2014-12-28 ENCOUNTER — Ambulatory Visit (INDEPENDENT_AMBULATORY_CARE_PROVIDER_SITE_OTHER): Payer: BLUE CROSS/BLUE SHIELD | Admitting: Nurse Practitioner

## 2014-12-28 VITALS — BP 118/70 | HR 93 | Temp 98.5°F | Resp 16 | Ht 68.0 in | Wt 172.2 lb

## 2014-12-28 DIAGNOSIS — Z1239 Encounter for other screening for malignant neoplasm of breast: Secondary | ICD-10-CM

## 2014-12-28 DIAGNOSIS — Z0000 Encounter for general adult medical examination without abnormal findings: Secondary | ICD-10-CM | POA: Diagnosis not present

## 2014-12-28 NOTE — Progress Notes (Signed)
Patient ID: Carolyn Bass, female    DOB: 09-Apr-1956  Age: 59 y.o. MRN: [id number]  CC: Annual Exam   HPI Carolyn Bass presents for Carolyn Bass annual physical.   1) Health Maintenance-   Diet- Eats at home- vegetarian, eats fish   Exercise- Swimming for 1 hr 5 x a week   Immunizations- UTD  Mammogram- Needs  Pap- 2016  Bone Density- N/A  Colonoscopy- 2013   Eye Exam- 2014   Dental Exam- UTD  Dermatology- skin cancer check   2) Acute Problems- None, denies needing refills today.   History Carolyn Bass has a past medical history of Colon cancer (1997); DVT (deep venous thrombosis); Hypothyroidism; Prediabetes (02/29/12); Thyroid nodule (02/29/12); Eustachian tube dysfunction (07/03/12); and Hypertriglyceridemia without hypercholesterolemia.   Carolyn Bass has past surgical history that includes Tubal ligation (2001); Knee arthroscopy w/ ACL reconstruction (Left, 1990); Colon resection (1997); Tonsillectomy and adenoidectomy (1962); and Hernia repair (2002).   Carolyn Bass family history includes Diabetes in Carolyn Bass son; Hyperlipidemia in Carolyn Bass brother.Carolyn Bass reports that Carolyn Bass has never smoked. Carolyn Bass has never used smokeless tobacco. Carolyn Bass reports that Carolyn Bass drinks alcohol. Carolyn Bass reports that Carolyn Bass does not use illicit drugs.  Outpatient Prescriptions Prior to Visit  Medication Sig Dispense Refill  . thyroid (ARMOUR) 30 MG tablet Take 1 tablet (30 mg total) by mouth daily before breakfast. 90 tablet 3  . acetaminophen-codeine (TYLENOL #3) 300-30 MG per tablet Take 1 tablet by mouth every 4 (four) hours as needed for moderate pain (cough). (Patient not taking: Reported on 12/20/2014) 60 tablet 0  . albuterol (PROVENTIL HFA;VENTOLIN HFA) 108 (90 BASE) MCG/ACT inhaler Inhale 2 puffs into the lungs every 6 (six) hours as needed for wheezing or shortness of breath. (Patient not taking: Reported on 12/20/2014) 1 Inhaler 11  . cholestyramine light (PREVALITE) 4 GM/DOSE powder Take 1 packet (4 g total) by mouth 3 (three) times daily with meals.  (Patient not taking: Reported on 12/20/2014) 90 packet 12  . levofloxacin (LEVAQUIN) 500 MG tablet Take 1 tablet (500 mg total) by mouth daily. (Patient not taking: Reported on 12/20/2014) 7 tablet 0  . Nutritional Supplements (ESTROVEN PO) Take 1 tablet by mouth 2 (two) times daily.    . predniSONE (STERAPRED UNI-PAK) 10 MG tablet 6 tablets on Day 1 , then reduce by 1 tablet daily until gone (Patient not taking: Reported on 12/20/2014) 21 tablet 0   No facility-administered medications prior to visit.    ROS Review of Systems  Constitutional: Negative for fever, chills, diaphoresis, fatigue and unexpected weight change.  HENT: Negative for tinnitus and trouble swallowing.   Gastrointestinal: Negative for nausea, vomiting, abdominal pain, diarrhea, constipation and blood in stool.  Endocrine: Negative for polydipsia, polyphagia and polyuria.  Genitourinary: Negative for dysuria, hematuria, vaginal discharge and vaginal pain.  Musculoskeletal: Negative for myalgias, back pain, arthralgias and gait problem.  Skin: Negative for color change and rash.  Neurological: Negative for dizziness, weakness, numbness and headaches.  Hematological: Does not bruise/bleed easily.  Psychiatric/Behavioral: Negative for suicidal ideas and sleep disturbance. The patient is not nervous/anxious.     Objective:  BP 118/70 mmHg  Pulse 93  Temp(Src) 98.5 F (36.9 C)  Resp 16  Ht 5\' 8"  (1.727 m)  Wt 172 lb 3.2 oz (78.109 kg)  BMI 26.19 kg/m2  SpO2 97%  Physical Exam  Constitutional: Carolyn Bass is oriented to person, place, and time. Carolyn Bass appears well-developed and well-nourished. No distress.  HENT:  Head: Normocephalic and atraumatic.  Right Ear: External ear normal.  Left Ear: External ear normal.  Nose: Nose normal.  Mouth/Throat: Oropharynx is clear and moist. No oropharyngeal exudate.  TMs and canals clear bilaterally  Eyes: Conjunctivae and EOM are normal. Pupils are equal, round, and reactive to light.  Right eye exhibits no discharge. Left eye exhibits no discharge. No scleral icterus.  Neck: Normal range of motion. Neck supple. No thyromegaly present.  Cardiovascular: Normal rate, regular rhythm, normal heart sounds and intact distal pulses.  Exam reveals no gallop and no friction rub.   No murmur heard. Pulmonary/Chest: Effort normal and breath sounds normal. No respiratory distress. Carolyn Bass has no wheezes. Carolyn Bass has no rales. Carolyn Bass exhibits no tenderness.  Abdominal: Soft. Bowel sounds are normal. Carolyn Bass exhibits no distension and no mass. There is no tenderness. There is no rebound and no guarding.  Musculoskeletal: Normal range of motion. Carolyn Bass exhibits no edema or tenderness.  Lymphadenopathy:    Carolyn Bass has no cervical adenopathy.  Neurological: Carolyn Bass is alert and oriented to person, place, and time. Carolyn Bass has normal reflexes. No cranial nerve deficit. Carolyn Bass exhibits normal muscle tone. Coordination normal.  Skin: Skin is warm and dry. No rash noted. Carolyn Bass is not diaphoretic. No erythema. No pallor.  Psychiatric: Carolyn Bass has a normal mood and affect. Carolyn Bass behavior is normal. Judgment and thought content normal.   Assessment & Plan:   Carolyn Bass was seen today for annual exam.  Diagnoses and all orders for this visit:  Screening for breast cancer -     MM Digital Screening; Future  Routine general medical examination at a health care facility   I have discontinued Carolyn Bass's Nutritional Supplements (ESTROVEN PO), cholestyramine light, acetaminophen-codeine, albuterol, predniSONE, and levofloxacin. I am also having Carolyn Bass maintain Carolyn Bass thyroid and Multiple Vitamins-Minerals (MULTIVITAMIN ADULTS 50+ PO).  Meds ordered this encounter  Medications  . Multiple Vitamins-Minerals (MULTIVITAMIN ADULTS 50+ PO)    Sig: Take 1 tablet by mouth daily.     Follow-up: Return in about 1 year (around 12/28/2015) for CPE.

## 2014-12-28 NOTE — Patient Instructions (Signed)

## 2014-12-28 NOTE — Progress Notes (Signed)
Pre visit review using our clinic review tool, if applicable. No additional management support is needed unless otherwise documented below in the visit note. 

## 2014-12-30 ENCOUNTER — Encounter: Payer: Self-pay | Admitting: Nurse Practitioner

## 2014-12-30 DIAGNOSIS — Z0001 Encounter for general adult medical examination with abnormal findings: Secondary | ICD-10-CM | POA: Insufficient documentation

## 2014-12-30 DIAGNOSIS — Z0000 Encounter for general adult medical examination without abnormal findings: Secondary | ICD-10-CM | POA: Insufficient documentation

## 2014-12-30 HISTORY — DX: Encounter for general adult medical examination without abnormal findings: Z00.00

## 2014-12-30 NOTE — Assessment & Plan Note (Addendum)
Discussed acute and chronic issues. Reviewed health maintenance measures, PFSHx, and immunizations. Obtained routine fasting labs TSH, Lipid panel, CBC w/ diff, A1c, and CMET.   Discussed w/ pt  No need for PAP today and pt reports she would not like her mammogram any sooner than 3 yrs (discussed and I am okay with this as long as she has yearly clinical breast exams on off years).

## 2014-12-30 NOTE — Assessment & Plan Note (Signed)
Mammogram order placed

## 2015-07-01 ENCOUNTER — Encounter: Payer: Self-pay | Admitting: Nurse Practitioner

## 2015-07-05 ENCOUNTER — Encounter: Payer: Self-pay | Admitting: Nurse Practitioner

## 2015-07-18 ENCOUNTER — Encounter: Payer: Self-pay | Admitting: Nurse Practitioner

## 2015-11-04 ENCOUNTER — Encounter: Payer: Self-pay | Admitting: Family Medicine

## 2015-11-04 ENCOUNTER — Ambulatory Visit (INDEPENDENT_AMBULATORY_CARE_PROVIDER_SITE_OTHER): Payer: BLUE CROSS/BLUE SHIELD | Admitting: Family Medicine

## 2015-11-04 VITALS — BP 146/84 | HR 115 | Temp 98.6°F | Ht 68.0 in | Wt 175.2 lb

## 2015-11-04 DIAGNOSIS — E039 Hypothyroidism, unspecified: Secondary | ICD-10-CM

## 2015-11-04 DIAGNOSIS — J988 Other specified respiratory disorders: Secondary | ICD-10-CM | POA: Diagnosis not present

## 2015-11-04 DIAGNOSIS — E538 Deficiency of other specified B group vitamins: Secondary | ICD-10-CM | POA: Diagnosis not present

## 2015-11-04 LAB — VITAMIN B12: VITAMIN B 12: 1231 pg/mL — AB (ref 200–1100)

## 2015-11-04 LAB — TSH: TSH: 1.49 m[IU]/L

## 2015-11-04 MED ORDER — THYROID 30 MG PO TABS: 30.0000 mg | ORAL_TABLET | Freq: Every day | ORAL | Status: DC

## 2015-11-04 NOTE — Patient Instructions (Signed)
Sudafed for congestion.  Call if you fail to improve or worsen.  I refilled your thyroid medicine.   Take care  Dr. Lacinda Axon

## 2015-11-04 NOTE — Progress Notes (Signed)
Pre visit review using our clinic review tool, if applicable. No additional management support is needed unless otherwise documented below in the visit note. 

## 2015-11-07 DIAGNOSIS — J988 Other specified respiratory disorders: Secondary | ICD-10-CM | POA: Insufficient documentation

## 2015-11-07 NOTE — Assessment & Plan Note (Signed)
No acute problem. No indication for antibiotics at this time. Advised Sudafed for congestion.

## 2015-11-07 NOTE — Progress Notes (Signed)
Subjective:  Patient ID: Carolyn Bass, female    DOB: [birth year]  Age: 60 y.o. MRN: [id number]  CC: Cough, congestion  HPI:  60 year old female presents with the above complaints.  Patient states that she's been sick for the past week. She's been expanding cough, congestion, ringing in the ears, headache, dizziness. She states that she's been slowly improving but the cough and congestion are quite bad. She reports of shortness of breath. No fevers or chills. No known exacerbating or relieving factors. No other complaints at this time.  Of note, patient does want her thyroid and B12 checked.  Social Hx   Social History   Social History  . Marital Status: Single    Spouse Name: N/A  . Number of Children: 0  . Years of Education: 18   Occupational History  . Sales & Marketing     Retired   Social History Main Topics  . Smoking status: Never Smoker   . Smokeless tobacco: Never Used  . Alcohol Use: Yes     Comment: 1 drink weekly  . Drug Use: No  . Sexual Activity: Not Asked   Other Topics Concern  . None   Social History Narrative   Living in [place]. Divorced. Moved from [place] to this area in April 2015. She has an Loss adjuster, chartered. Retired from Tour manager.      Hobbies: cross country ski, gardening, travel, reading, sewing   Caffeine: tea 2 cups daily   Exercise: [employer]. Workouts 4x/week - cardio, weight training   Diet: pescetarian   Review of Systems  Constitutional: Negative for fever.  HENT: Positive for congestion.   Respiratory: Positive for cough.   Neurological: Positive for dizziness and headaches.   Objective:  BP 146/84 mmHg  Pulse 115  Temp(Src) 98.6 F (37 C) (Oral)  Ht 5\' 8"  (1.727 m)  Wt 175 lb 4 oz (79.493 kg)  BMI 26.65 kg/m2  SpO2 95%  BP/Weight 11/04/2015 [id number]  Systolic BP 123456 123456 [id number]  Diastolic BP 84 70 68  Wt. (Lbs) 175.25 172.2 173  BMI 26.65 26.19 26.31   Physical Exam  Constitutional: She is oriented to  person, place, and time. She appears well-developed. No distress.  HENT:  Head: Normocephalic.  Mouth/Throat: Oropharynx is clear and moist.  Eyes: Conjunctivae are normal. No scleral icterus.  Neck: Neck supple.  Cardiovascular: Normal rate and regular rhythm.   Pulmonary/Chest: Effort normal and breath sounds normal. She has no wheezes. She has no rales.  Neurological: She is alert and oriented to person, place, and time.  Psychiatric: She has a normal mood and affect.  Vitals reviewed.  Lab Results  Component Value Date   WBC 6.2 12/27/2014   HGB 14.6 12/27/2014   HCT 44.2 12/27/2014   PLT 156.0 12/27/2014   GLUCOSE 92 12/27/2014   CHOL 174 12/27/2014   TRIG 316.0* 12/27/2014   HDL 49.50 12/27/2014   LDLDIRECT 82.0 12/27/2014   LDLCALC 68 12/31/2013   ALT 19 12/27/2014   AST 19 12/27/2014   NA 139 12/27/2014   K 4.3 12/27/2014   CL 103 12/27/2014   CREATININE 0.73 12/27/2014   BUN 11 12/27/2014   CO2 27 12/27/2014   TSH 1.49 11/04/2015    Assessment & Plan:   Problem List Items Addressed This Visit    B12 deficiency   Relevant Orders   Vitamin B12 (Completed)   Respiratory infection - Primary    No acute problem. No indication for antibiotics  at this time. Advised Sudafed for congestion.      RESOLVED: Thyroid activity decreased   Relevant Medications   thyroid (ARMOUR) 30 MG tablet   Other Relevant Orders   TSH (Completed)      Meds ordered this encounter  Medications  . thyroid (ARMOUR) 30 MG tablet    Sig: Take 1 tablet (30 mg total) by mouth daily before breakfast.    Dispense:  90 tablet    Refill:  3    Follow-up: PRN  [place]

## 2015-12-01 ENCOUNTER — Encounter: Payer: Self-pay | Admitting: Family Medicine

## 2015-12-01 ENCOUNTER — Other Ambulatory Visit: Payer: Self-pay | Admitting: Nurse Practitioner

## 2016-02-08 ENCOUNTER — Encounter: Payer: Self-pay | Admitting: Family Medicine

## 2016-02-08 ENCOUNTER — Other Ambulatory Visit: Payer: Self-pay | Admitting: Family Medicine

## 2016-02-08 MED ORDER — THYROID 30 MG PO TABS: ORAL_TABLET | ORAL | 2 refills | Status: DC

## 2016-02-10 ENCOUNTER — Other Ambulatory Visit: Payer: Self-pay | Admitting: Internal Medicine

## 2016-04-19 ENCOUNTER — Encounter: Payer: Self-pay | Admitting: Family Medicine

## 2016-04-24 ENCOUNTER — Other Ambulatory Visit: Payer: Self-pay | Admitting: Family Medicine

## 2016-04-24 MED ORDER — THYROID 30 MG PO TABS: ORAL_TABLET | ORAL | 2 refills | Status: DC

## 2016-05-01 ENCOUNTER — Other Ambulatory Visit: Payer: Self-pay | Admitting: Internal Medicine

## 2016-05-01 DIAGNOSIS — E039 Hypothyroidism, unspecified: Secondary | ICD-10-CM

## 2016-05-01 DIAGNOSIS — Z1239 Encounter for other screening for malignant neoplasm of breast: Secondary | ICD-10-CM

## 2016-05-01 DIAGNOSIS — E559 Vitamin D deficiency, unspecified: Secondary | ICD-10-CM

## 2016-05-01 DIAGNOSIS — R5383 Other fatigue: Secondary | ICD-10-CM

## 2016-05-01 DIAGNOSIS — E7800 Pure hypercholesterolemia, unspecified: Secondary | ICD-10-CM

## 2016-05-01 DIAGNOSIS — E538 Deficiency of other specified B group vitamins: Secondary | ICD-10-CM

## 2016-05-02 NOTE — Progress Notes (Signed)
Please advise 

## 2016-05-03 ENCOUNTER — Other Ambulatory Visit: Payer: Self-pay | Admitting: Internal Medicine

## 2016-06-07 ENCOUNTER — Other Ambulatory Visit: Payer: BLUE CROSS/BLUE SHIELD

## 2016-06-11 ENCOUNTER — Encounter: Payer: BLUE CROSS/BLUE SHIELD | Admitting: Internal Medicine

## 2016-06-11 ENCOUNTER — Other Ambulatory Visit: Payer: Self-pay | Admitting: Internal Medicine

## 2016-06-11 DIAGNOSIS — A0811 Acute gastroenteropathy due to Norwalk agent: Secondary | ICD-10-CM

## 2016-06-11 MED ORDER — DIPHENOXYLATE-ATROPINE 2.5-0.025 MG PO TABS: 1.0000 | ORAL_TABLET | Freq: Four times a day (QID) | ORAL | 0 refills | Status: DC | PRN

## 2016-06-20 ENCOUNTER — Other Ambulatory Visit (INDEPENDENT_AMBULATORY_CARE_PROVIDER_SITE_OTHER): Payer: BLUE CROSS/BLUE SHIELD

## 2016-06-20 DIAGNOSIS — E538 Deficiency of other specified B group vitamins: Secondary | ICD-10-CM | POA: Diagnosis not present

## 2016-06-20 DIAGNOSIS — E559 Vitamin D deficiency, unspecified: Secondary | ICD-10-CM

## 2016-06-20 DIAGNOSIS — R5383 Other fatigue: Secondary | ICD-10-CM

## 2016-06-20 DIAGNOSIS — E039 Hypothyroidism, unspecified: Secondary | ICD-10-CM | POA: Diagnosis not present

## 2016-06-20 DIAGNOSIS — E7800 Pure hypercholesterolemia, unspecified: Secondary | ICD-10-CM | POA: Diagnosis not present

## 2016-06-20 LAB — COMPREHENSIVE METABOLIC PANEL
ALT: 26 U/L (ref 0–35)
AST: 23 U/L (ref 0–37)
Albumin: 4.2 g/dL (ref 3.5–5.2)
Alkaline Phosphatase: 60 U/L (ref 39–117)
BUN: 12 mg/dL (ref 6–23)
CALCIUM: 9.1 mg/dL (ref 8.4–10.5)
CHLORIDE: 104 meq/L (ref 96–112)
CO2: 29 meq/L (ref 19–32)
Creatinine, Ser: 0.76 mg/dL (ref 0.40–1.20)
GFR: 82.39 mL/min (ref >60.00)
Glucose, Bld: 91 mg/dL (ref 70–99)
POTASSIUM: 3.7 meq/L (ref 3.5–5.1)
Sodium: 140 mEq/L (ref 135–145)
Total Bilirubin: 0.5 mg/dL (ref 0.2–1.2)
Total Protein: 6.5 g/dL (ref 6.0–8.3)

## 2016-06-20 LAB — IBC PANEL
IRON: 55 ug/dL (ref 42–145)
SATURATION RATIOS: 15.5 % — AB (ref 20.0–50.0)
Transferrin: 253 mg/dL (ref 212.0–360.0)

## 2016-06-20 LAB — LIPID PANEL
CHOLESTEROL: 148 mg/dL (ref 0–200)
HDL: 41.1 mg/dL (ref >39.00)
LDL Cholesterol: 80 mg/dL (ref 0–99)
NonHDL: 106.88
Total CHOL/HDL Ratio: 4
Triglycerides: 136 mg/dL (ref 0.0–149.0)
VLDL: 27.2 mg/dL (ref 0.0–40.0)

## 2016-06-20 LAB — CBC WITH DIFFERENTIAL/PLATELET
Basophils Absolute: 0 10*3/uL (ref 0.0–0.1)
Basophils Relative: 0.6 % (ref 0.0–3.0)
EOS PCT: 1.7 % (ref 0.0–5.0)
Eosinophils Absolute: 0.1 10*3/uL (ref 0.0–0.7)
HCT: 39.9 % (ref 36.0–46.0)
Hemoglobin: 13.4 g/dL (ref 12.0–15.0)
LYMPHS ABS: 1.4 10*3/uL (ref 0.7–4.0)
Lymphocytes Relative: 33.1 % (ref 12.0–46.0)
MCHC: 33.7 g/dL (ref 30.0–36.0)
MCV: 86.3 fl (ref 78.0–100.0)
MONO ABS: 0.4 10*3/uL (ref 0.1–1.0)
Monocytes Relative: 8.8 % (ref 3.0–12.0)
NEUTROS PCT: 55.8 % (ref 43.0–77.0)
Neutro Abs: 2.3 10*3/uL (ref 1.4–7.7)
Platelets: 171 10*3/uL (ref 150.0–400.0)
RBC: 4.62 Mil/uL (ref 3.87–5.11)
RDW: 13.7 % (ref 11.5–15.5)
WBC: 4.1 10*3/uL (ref 4.0–10.5)

## 2016-06-20 LAB — VITAMIN B12: Vitamin B-12: 280 pg/mL (ref 211–911)

## 2016-06-20 LAB — TSH: TSH: 2.64 u[IU]/mL (ref 0.35–4.50)

## 2016-06-20 LAB — LDL CHOLESTEROL, DIRECT: Direct LDL: 77 mg/dL

## 2016-06-20 LAB — VITAMIN D 25 HYDROXY (VIT D DEFICIENCY, FRACTURES): VITD: 26.98 ng/mL — ABNORMAL LOW (ref 30.00–100.00)

## 2016-06-21 LAB — HIV ANTIBODY (ROUTINE TESTING W REFLEX): HIV: NONREACTIVE

## 2016-06-21 LAB — HEPATITIS C ANTIBODY: HCV AB: NEGATIVE

## 2016-06-22 ENCOUNTER — Telehealth: Payer: Self-pay | Admitting: Internal Medicine

## 2016-06-22 ENCOUNTER — Encounter: Payer: BLUE CROSS/BLUE SHIELD | Admitting: Internal Medicine

## 2016-06-22 ENCOUNTER — Ambulatory Visit (INDEPENDENT_AMBULATORY_CARE_PROVIDER_SITE_OTHER): Payer: BLUE CROSS/BLUE SHIELD | Admitting: Internal Medicine

## 2016-06-22 ENCOUNTER — Other Ambulatory Visit (HOSPITAL_COMMUNITY)
Admission: RE | Admit: 2016-06-22 | Discharge: 2016-06-22 | Disposition: A | Discharge status: Home / Self Care | Payer: BLUE CROSS/BLUE SHIELD | Source: Ambulatory Visit | Attending: Internal Medicine | Admitting: Internal Medicine

## 2016-06-22 ENCOUNTER — Encounter: Payer: Self-pay | Admitting: Internal Medicine

## 2016-06-22 VITALS — BP 100/70 | HR 97 | Temp 98.1°F | Resp 16 | Ht 68.0 in | Wt 173.0 lb

## 2016-06-22 DIAGNOSIS — E038 Other specified hypothyroidism: Secondary | ICD-10-CM | POA: Diagnosis not present

## 2016-06-22 DIAGNOSIS — A0811 Acute gastroenteropathy due to Norwalk agent: Secondary | ICD-10-CM

## 2016-06-22 DIAGNOSIS — Z1151 Encounter for screening for human papillomavirus (HPV): Secondary | ICD-10-CM | POA: Diagnosis not present

## 2016-06-22 DIAGNOSIS — Z0000 Encounter for general adult medical examination without abnormal findings: Secondary | ICD-10-CM

## 2016-06-22 DIAGNOSIS — Z124 Encounter for screening for malignant neoplasm of cervix: Secondary | ICD-10-CM

## 2016-06-22 DIAGNOSIS — Z1231 Encounter for screening mammogram for malignant neoplasm of breast: Secondary | ICD-10-CM

## 2016-06-22 DIAGNOSIS — Z01419 Encounter for gynecological examination (general) (routine) without abnormal findings: Secondary | ICD-10-CM | POA: Diagnosis not present

## 2016-06-22 DIAGNOSIS — Z1239 Encounter for other screening for malignant neoplasm of breast: Secondary | ICD-10-CM

## 2016-06-22 DIAGNOSIS — E538 Deficiency of other specified B group vitamins: Secondary | ICD-10-CM

## 2016-06-22 MED ORDER — ALBUTEROL SULFATE HFA 108 (90 BASE) MCG/ACT IN AERS: 2.0000 | INHALATION_SPRAY | Freq: Four times a day (QID) | RESPIRATORY_TRACT | 11 refills | Status: DC | PRN

## 2016-06-22 NOTE — Progress Notes (Signed)
Patient ID: Natalynn Kortan, female    DOB: 1955/09/04  Age: 61 y.o. MRN: [id number]  The patient is here for annual examination and management of other chronic and acute problems.  fobt neg 2014 (perecords) no recent mammogram No recent PAP smear Colonoscopy every 10 years  Last one in 2012  The risk factors are reflected in the social history.  The roster of all physicians providing medical care to patient - is listed in the Snapshot section of the chart.  Activities of daily living:  The patient is 100% independent in all ADLs: dressing, toileting, feeding as well as independent mobility  Home safety : The patient has smoke detectors in the home. They wear seatbelts.  There are no firearms at home. There is no violence in the home.   There is no risks for hepatitis, STDs or HIV. There is no   history of blood transfusion. They have no travel history to infectious disease endemic areas of the world.  The patient has seen their dentist in the last six month. They have seen their eye doctor in the last year. They do not  have excessive sun exposure. Discussed the need for sun protection: hats, long sleeves and use of sunscreen if there is significant sun exposure.   Diet: the importance of a healthy diet is discussed. They do have a healthy diet.  The benefits of regular aerobic exercise were discussed. She walks 4 times per week ,  20 minutes.   Depression screen: there are no signs or vegative symptoms of depression- irritability, change in appetite, anhedonia, sadness/tearfullness.  The following portions of the patient's history were reviewed and updated as appropriate: allergies, current medications, past family history, past medical history,  past surgical history, past social history  and problem list.  Visual acuity was not assessed per patient preference since she has regular follow up with her ophthalmologist. Hearing and body mass index were assessed and reviewed.   During the  course of the visit the patient was educated and counseled about appropriate screening and preventive services including : fall prevention , diabetes screening, nutrition counseling, colorectal cancer screening, and recommended immunizations.    CC: The primary encounter diagnosis was Screening for cervical cancer. Diagnoses of Other specified hypothyroidism, Norovirus, B12 deficiency, Encounter for preventive health examination, and Breast cancer screening were also pertinent to this visit.  Caught a viral URI on the [facility] over Christmas, brought on by breathing diesel fumes which caused a bronchitis.  Had a  Dry cough,  Fever   Then had a syncopal  Episode in mid January while infected with the  Norovirus episode    History Fallynn has a past medical history of Colon cancer ([place]) (1997); DVT (deep venous thrombosis) ([place]); Eustachian tube dysfunction (07/03/12); Hypertriglyceridemia without hypercholesterolemia; Hypothyroidism; Prediabetes (02/29/12); and Thyroid nodule (02/29/12).   She has a past surgical history that includes Tubal ligation (2001); Knee arthroscopy w/ ACL reconstruction (Left, 1990); Colon resection (1997); Tonsillectomy and adenoidectomy (1962); and Hernia repair (2002).   Her family history includes Diabetes in her son; Hyperlipidemia in her brother.She reports that she has never smoked. She has never used smokeless tobacco. She reports that she drinks alcohol. She reports that she does not use drugs.  Outpatient Medications Prior to Visit  Medication Sig Dispense Refill  . diphenoxylate-atropine (LOMOTIL) 2.5-0.025 MG tablet Take 1 tablet by mouth 4 (four) times daily as needed for diarrhea or loose stools. 30 tablet 0  . Multiple Vitamins-Minerals (MULTIVITAMIN ADULTS  50+ PO) Take 1 tablet by mouth daily.    Marland Kitchen thyroid (ARMOUR THYROID) 30 MG tablet TAKE 1 BY MOUTH DAILY BEFORE BREAKFAST 90 tablet 2   No facility-administered medications prior to visit.     Review of  Systems   Patient denies headache, fevers, malaise, unintentional weight loss, skin rash, eye pain, sinus congestion and sinus pain, sore throat, dysphagia,  hemoptysis , cough, dyspnea, wheezing, chest pain, palpitations, orthopnea, edema, abdominal pain, nausea, melena, diarrhea, constipation, flank pain, dysuria, hematuria, urinary  Frequency, nocturia, numbness, tingling, seizures,  Focal weakness, Loss of consciousness,  Tremor, insomnia, depression, anxiety, and suicidal ideation.     Objective:  BP 100/70   Pulse 97   Temp 98.1 F (36.7 C) (Oral)   Resp 16   Ht 5\' 8"  (1.727 m)   Wt 173 lb (78.5 kg)   SpO2 97%   BMI 26.30 kg/m   Physical Exam   General Appearance:    Alert, cooperative, no distress, appears stated age  Head:    Normocephalic, without obvious abnormality, atraumatic  Eyes:    PERRL, conjunctiva/corneas clear, EOM's intact, fundi    benign, both eyes  Ears:    Normal TM's and external ear canals, both ears  Nose:   Nares normal, septum midline, mucosa normal, no drainage    or sinus tenderness  Throat:   Lips, mucosa, and tongue normal; teeth and gums normal  Neck:   Supple, symmetrical, trachea midline, no adenopathy;    thyroid:  no enlargement/tenderness/nodules; no carotid   bruit or JVD  Back:     Symmetric, no curvature, ROM normal, no CVA tenderness  Lungs:     Clear to auscultation bilaterally, respirations unlabored  Chest Wall:    No tenderness or deformity   Heart:    Regular rate and rhythm, S1 and S2 normal, no murmur, rub   or gallop  Breast Exam:    No tenderness, masses, or nipple abnormality  Abdomen:     Soft, non-tender, bowel sounds active all four quadrants,    no masses, no organomegaly  Genitalia:    Pelvic: cervix normal in appearance, external genitalia normal, no adnexal masses or tenderness, no cervical motion tenderness, rectovaginal septum normal, uterus normal size, shape, and consistency and vagina normal without discharge   Extremities:   Extremities normal, atraumatic, no cyanosis or edema  Pulses:   2+ and symmetric all extremities  Skin:   Skin color, texture, turgor normal, no rashes or lesions  Lymph nodes:   Cervical, supraclavicular, and axillary nodes normal  Neurologic:   CNII-XII intact, normal strength, sensation and reflexes    throughout     Assessment & Plan:   Problem List Items Addressed This Visit    B12 deficiency    Secondary to vegetarian diet. Continue b12 supplementation  Lab Results  Component Value Date   VITAMINB12 280 06/20/2016         Encounter for preventive health examination    Annual comprehensive preventive exam was done as well as an evaluation and management of chronic conditions .  During the course of the visit the patient was educated and counseled about appropriate screening and preventive services including :  diabetes screening, lipid analysis with projected  10 year  risk for CAD , nutrition counseling, breast, cervical and colorectal cancer screening, and recommended immunizations.  Printed recommendations for health maintenance screenings was given      Norovirus    complicated by syncope and prolonged diarrhea.  Resolved with addition of lomotil       Other specified hypothyroidism    .Thyroid function is normal on current dose. No changes today   Lab Results  Component Value Date   TSH 2.64 06/20/2016          Other Visit Diagnoses    Screening for cervical cancer    -  Primary   Relevant Orders   Cytology - PAP   Breast cancer screening       Relevant Orders   MM SCREENING BREAST TOMO BILATERAL      I am having Ms. Percell Miller maintain her Multiple Vitamins-Minerals (MULTIVITAMIN ADULTS 50+ PO), thyroid, diphenoxylate-atropine, and albuterol.  Meds ordered this encounter  Medications  . albuterol (PROVENTIL HFA;VENTOLIN HFA) 108 (90 Base) MCG/ACT inhaler    Sig: Inhale 2 puffs into the lungs every 6 (six) hours as needed for wheezing or  shortness of breath.    Dispense:  1 Inhaler    Refill:  11    There are no discontinued medications.  Follow-up: No Follow-up on file.   Crecencio Mc, MD

## 2016-06-22 NOTE — Progress Notes (Signed)
Pre visit review using our clinic review tool, if applicable. No additional management support is needed unless otherwise documented below in the visit note. 

## 2016-06-22 NOTE — Telephone Encounter (Signed)
Pt dropped off lab results that Dr. Derrel Nip requested. Result are in Dr. Lupita Dawn color folder up front.

## 2016-06-22 NOTE — Telephone Encounter (Signed)
Results have been dropped into Dr. Ansel Bong folder.

## 2016-06-24 ENCOUNTER — Encounter: Payer: Self-pay | Admitting: Internal Medicine

## 2016-06-24 ENCOUNTER — Other Ambulatory Visit: Payer: Self-pay | Admitting: Internal Medicine

## 2016-06-24 NOTE — Assessment & Plan Note (Signed)
.  Thyroid function is normal on current dose. No changes today   Lab Results  Component Value Date   TSH 2.64 06/20/2016

## 2016-06-24 NOTE — Patient Instructions (Signed)

## 2016-06-24 NOTE — Assessment & Plan Note (Signed)
complicated by syncope and prolonged diarrhea. Resolved with addition of lomotil

## 2016-06-24 NOTE — Assessment & Plan Note (Signed)
Secondary to vegetarian diet. Continue b12 supplementation  Lab Results  Component Value Date   VITAMINB12 280 06/20/2016

## 2016-06-24 NOTE — Assessment & Plan Note (Signed)
Annual comprehensive preventive exam was done as well as an evaluation and management of chronic conditions .  During the course of the visit the patient was educated and counseled about appropriate screening and preventive services including :  diabetes screening, lipid analysis with projected  10 year  risk for CAD , nutrition counseling, breast, cervical and colorectal cancer screening, and recommended immunizations.  Printed recommendations for health maintenance screenings was given 

## 2016-06-25 ENCOUNTER — Telehealth: Payer: Self-pay | Admitting: Internal Medicine

## 2016-06-25 MED ORDER — DIPHENOXYLATE-ATROPINE 2.5-0.025 MG PO TABS: 1.0000 | ORAL_TABLET | Freq: Four times a day (QID) | ORAL | 0 refills | Status: DC | PRN

## 2016-06-25 NOTE — Telephone Encounter (Signed)
Chart updated with last colonoscopy and last mammogram

## 2016-06-26 ENCOUNTER — Telehealth: Payer: Self-pay | Admitting: *Deleted

## 2016-06-26 LAB — CYTOLOGY - PAP
DIAGNOSIS: NEGATIVE
HPV: NOT DETECTED

## 2016-06-26 NOTE — Telephone Encounter (Signed)
Rx faxed to [facility] [place]

## 2016-06-26 NOTE — Telephone Encounter (Signed)
I called pt and Left mess for patient to call back. I need to know which pharmacy to fax her Lomotil to . I have printed script at my desk.

## 2016-06-26 NOTE — Telephone Encounter (Signed)
pt requested to have this faxed to [facility] on [address]

## 2016-06-27 ENCOUNTER — Encounter: Payer: Self-pay | Admitting: Internal Medicine

## 2016-07-03 ENCOUNTER — Encounter: Payer: Self-pay | Admitting: Internal Medicine

## 2016-08-10 ENCOUNTER — Encounter: Payer: BLUE CROSS/BLUE SHIELD | Admitting: Internal Medicine

## 2016-08-15 ENCOUNTER — Ambulatory Visit
Admission: RE | Admit: 2016-08-15 | Discharge: 2016-08-15 | Disposition: A | Discharge status: Home / Self Care | Payer: BLUE CROSS/BLUE SHIELD | Source: Ambulatory Visit | Attending: Internal Medicine | Admitting: Internal Medicine

## 2016-08-15 DIAGNOSIS — Z1231 Encounter for screening mammogram for malignant neoplasm of breast: Secondary | ICD-10-CM | POA: Diagnosis not present

## 2016-08-15 DIAGNOSIS — Z1239 Encounter for other screening for malignant neoplasm of breast: Secondary | ICD-10-CM

## 2016-12-18 ENCOUNTER — Other Ambulatory Visit: Payer: Self-pay | Admitting: Family Medicine

## 2017-03-26 ENCOUNTER — Encounter: Payer: Self-pay | Admitting: Internal Medicine

## 2017-03-27 ENCOUNTER — Other Ambulatory Visit: Payer: Self-pay | Admitting: Internal Medicine

## 2017-03-27 MED ORDER — DIPHENOXYLATE-ATROPINE 2.5-0.025 MG PO TABS: 1.0000 | ORAL_TABLET | Freq: Four times a day (QID) | ORAL | 0 refills | Status: DC | PRN

## 2017-03-27 NOTE — Progress Notes (Signed)
Lomotil refilled  And printed.  Need to know which pharmacy

## 2017-04-01 ENCOUNTER — Other Ambulatory Visit: Payer: Self-pay

## 2017-04-01 MED ORDER — DIPHENOXYLATE-ATROPINE 2.5-0.025 MG PO TABS: 1.0000 | ORAL_TABLET | Freq: Four times a day (QID) | ORAL | 0 refills | Status: DC | PRN

## 2017-04-06 ENCOUNTER — Encounter: Payer: Self-pay | Admitting: Internal Medicine

## 2017-04-08 MED ORDER — DIPHENOXYLATE-ATROPINE 2.5-0.025 MG PO TABS: 1.0000 | ORAL_TABLET | Freq: Four times a day (QID) | ORAL | 0 refills | Status: DC | PRN

## 2017-04-08 MED ORDER — OSELTAMIVIR PHOSPHATE 75 MG PO CAPS: 75.0000 mg | ORAL_CAPSULE | Freq: Two times a day (BID) | ORAL | 0 refills | Status: DC

## 2017-04-08 NOTE — Telephone Encounter (Signed)
re faxed both scripts to [facility].

## 2017-04-08 NOTE — Telephone Encounter (Signed)
PATIENT DID NOT RECEIVE HER LOMOTIL FROM 2 WEEKS AGO.  PLESAE RESEND ALONG WITH TAMIFLU ANKE LET HER KNOW WHICH PHARMACY IT WAS SENT TO

## 2017-04-08 NOTE — Telephone Encounter (Signed)
Pt came by and picked up both rxs to take to the pharmacy.

## 2017-04-08 NOTE — Telephone Encounter (Signed)
Patient called and said the scripts were sent to [place] that [facility] should have not been in her chart anymore. I took it out & she needs them re sent to the [place] [facility], please

## 2017-05-30 ENCOUNTER — Other Ambulatory Visit: Payer: Self-pay | Admitting: Family Medicine

## 2017-05-30 NOTE — Telephone Encounter (Signed)
NEED APPT PRIOR TO NEXT REFILL. PLEASE CALL OFFICE FOR APPT.

## 2017-08-19 ENCOUNTER — Other Ambulatory Visit: Payer: Self-pay | Admitting: Internal Medicine

## 2017-08-19 NOTE — Telephone Encounter (Signed)
Las[facility] TSH 06/21/15 ok to fill?

## 2017-09-19 ENCOUNTER — Encounter: Payer: Self-pay | Admitting: Internal Medicine

## 2017-09-24 ENCOUNTER — Other Ambulatory Visit: Payer: Self-pay | Admitting: Internal Medicine

## 2017-09-24 DIAGNOSIS — E611 Iron deficiency: Secondary | ICD-10-CM

## 2017-09-24 DIAGNOSIS — E538 Deficiency of other specified B group vitamins: Secondary | ICD-10-CM

## 2017-09-24 DIAGNOSIS — E038 Other specified hypothyroidism: Secondary | ICD-10-CM

## 2017-09-24 DIAGNOSIS — E781 Pure hyperglyceridemia: Secondary | ICD-10-CM

## 2017-09-25 NOTE — Telephone Encounter (Signed)
LMTCB. Need to schedule pt for a fasting lab appt a few days before her office visit on 10/23/2017. Labs have been ordered. PEC may speak with pt.

## 2017-09-28 ENCOUNTER — Encounter: Payer: Self-pay | Admitting: Internal Medicine

## 2017-09-30 ENCOUNTER — Telehealth: Payer: Self-pay | Admitting: Internal Medicine

## 2017-09-30 DIAGNOSIS — E038 Other specified hypothyroidism: Secondary | ICD-10-CM

## 2017-09-30 DIAGNOSIS — E538 Deficiency of other specified B group vitamins: Secondary | ICD-10-CM

## 2017-09-30 NOTE — Telephone Encounter (Signed)
Ok to add the additional tests Ms Ottaway requested in [place] message . Use the codes in her chart for b12 def and hypothyriod

## 2017-10-01 NOTE — Telephone Encounter (Signed)
Labs have been ordered

## 2017-10-10 ENCOUNTER — Other Ambulatory Visit (INDEPENDENT_AMBULATORY_CARE_PROVIDER_SITE_OTHER): Payer: BLUE CROSS/BLUE SHIELD

## 2017-10-10 DIAGNOSIS — E781 Pure hyperglyceridemia: Secondary | ICD-10-CM

## 2017-10-10 DIAGNOSIS — E038 Other specified hypothyroidism: Secondary | ICD-10-CM | POA: Diagnosis not present

## 2017-10-10 DIAGNOSIS — E611 Iron deficiency: Secondary | ICD-10-CM

## 2017-10-10 LAB — LIPID PANEL
CHOL/HDL RATIO: 3
Cholesterol: 150 mg/dL (ref 0–200)
HDL: 51.7 mg/dL (ref >39.00)
LDL Cholesterol: 62 mg/dL (ref 0–99)
NONHDL: 97.88
TRIGLYCERIDES: 178 mg/dL — AB (ref 0.0–149.0)
VLDL: 35.6 mg/dL (ref 0.0–40.0)

## 2017-10-10 LAB — COMPREHENSIVE METABOLIC PANEL
ALT: 16 U/L (ref 0–35)
AST: 16 U/L (ref 0–37)
Albumin: 4.3 g/dL (ref 3.5–5.2)
Alkaline Phosphatase: 49 U/L (ref 39–117)
BILIRUBIN TOTAL: 0.6 mg/dL (ref 0.2–1.2)
BUN: 10 mg/dL (ref 6–23)
CHLORIDE: 105 meq/L (ref 96–112)
CO2: 28 mEq/L (ref 19–32)
CREATININE: 0.76 mg/dL (ref 0.40–1.20)
Calcium: 9.2 mg/dL (ref 8.4–10.5)
GFR: 82.03 mL/min (ref >60.00)
GLUCOSE: 97 mg/dL (ref 70–99)
Potassium: 3.7 mEq/L (ref 3.5–5.1)
SODIUM: 141 meq/L (ref 135–145)
Total Protein: 6.3 g/dL (ref 6.0–8.3)

## 2017-10-10 LAB — CBC WITH DIFFERENTIAL/PLATELET
BASOS ABS: 0 10*3/uL (ref 0.0–0.1)
BASOS PCT: 0.6 % (ref 0.0–3.0)
EOS ABS: 0 10*3/uL (ref 0.0–0.7)
Eosinophils Relative: 1.1 % (ref 0.0–5.0)
HCT: 42 % (ref 36.0–46.0)
Hemoglobin: 14.1 g/dL (ref 12.0–15.0)
LYMPHS ABS: 1.4 10*3/uL (ref 0.7–4.0)
Lymphocytes Relative: 36 % (ref 12.0–46.0)
MCHC: 33.6 g/dL (ref 30.0–36.0)
MCV: 86.7 fl (ref 78.0–100.0)
Monocytes Absolute: 0.3 10*3/uL (ref 0.1–1.0)
Monocytes Relative: 6.4 % (ref 3.0–12.0)
NEUTROS ABS: 2.2 10*3/uL (ref 1.4–7.7)
NEUTROS PCT: 55.9 % (ref 43.0–77.0)
PLATELETS: 142 10*3/uL — AB (ref 150.0–400.0)
RBC: 4.84 Mil/uL (ref 3.87–5.11)
RDW: 13.5 % (ref 11.5–15.5)
WBC: 4 10*3/uL (ref 4.0–10.5)

## 2017-10-10 LAB — TSH: TSH: 1.31 u[IU]/mL (ref 0.35–4.50)

## 2017-10-11 LAB — IRON,TIBC AND FERRITIN PANEL
%SAT: 19 % (calc) (ref 11–50)
Ferritin: 65 ng/mL (ref 20–288)
Iron: 56 ug/dL (ref 45–160)
TIBC: 301 ug/dL (ref 250–450)

## 2017-10-23 ENCOUNTER — Encounter: Payer: Self-pay | Admitting: Internal Medicine

## 2017-10-23 ENCOUNTER — Ambulatory Visit (INDEPENDENT_AMBULATORY_CARE_PROVIDER_SITE_OTHER): Payer: BLUE CROSS/BLUE SHIELD | Admitting: Internal Medicine

## 2017-10-23 VITALS — BP 136/80 | HR 103 | Temp 98.3°F | Resp 15 | Ht 68.0 in | Wt 151.6 lb

## 2017-10-23 DIAGNOSIS — Z1159 Encounter for screening for other viral diseases: Secondary | ICD-10-CM | POA: Diagnosis not present

## 2017-10-23 DIAGNOSIS — E538 Deficiency of other specified B group vitamins: Secondary | ICD-10-CM | POA: Diagnosis not present

## 2017-10-23 DIAGNOSIS — N6459 Other signs and symptoms in breast: Secondary | ICD-10-CM | POA: Diagnosis not present

## 2017-10-23 DIAGNOSIS — Z0001 Encounter for general adult medical examination with abnormal findings: Secondary | ICD-10-CM

## 2017-10-23 DIAGNOSIS — D696 Thrombocytopenia, unspecified: Secondary | ICD-10-CM

## 2017-10-23 DIAGNOSIS — Z85038 Personal history of other malignant neoplasm of large intestine: Secondary | ICD-10-CM | POA: Diagnosis not present

## 2017-10-23 DIAGNOSIS — E038 Other specified hypothyroidism: Secondary | ICD-10-CM

## 2017-10-23 DIAGNOSIS — E559 Vitamin D deficiency, unspecified: Secondary | ICD-10-CM | POA: Diagnosis not present

## 2017-10-23 LAB — CBC WITH DIFFERENTIAL/PLATELET
BASOS PCT: 0.5 % (ref 0.0–3.0)
Basophils Absolute: 0 10*3/uL (ref 0.0–0.1)
EOS ABS: 0 10*3/uL (ref 0.0–0.7)
Eosinophils Relative: 0.5 % (ref 0.0–5.0)
HEMATOCRIT: 42.2 % (ref 36.0–46.0)
Hemoglobin: 14.1 g/dL (ref 12.0–15.0)
LYMPHS PCT: 26.7 % (ref 12.0–46.0)
Lymphs Abs: 1.6 10*3/uL (ref 0.7–4.0)
MCHC: 33.4 g/dL (ref 30.0–36.0)
MCV: 86 fl (ref 78.0–100.0)
Monocytes Absolute: 0.3 10*3/uL (ref 0.1–1.0)
Monocytes Relative: 5.6 % (ref 3.0–12.0)
Neutro Abs: 3.9 10*3/uL (ref 1.4–7.7)
Neutrophils Relative %: 66.7 % (ref 43.0–77.0)
Platelets: 145 10*3/uL — ABNORMAL LOW (ref 150.0–400.0)
RBC: 4.91 Mil/uL (ref 3.87–5.11)
RDW: 13.4 % (ref 11.5–15.5)
WBC: 5.9 10*3/uL (ref 4.0–10.5)

## 2017-10-23 NOTE — Progress Notes (Signed)
Patient ID: Carolyn Bass, female    DOB: 26-May-1955  Age: 63 y.o. MRN: [id number]  The patient is here for annual preventive  examination and management of other chronic and acute problems.   Mild thrombocytopenia discussed   Taking selenmethionine Nettle and quercetin   Home BP readings 103/70  The risk factors are reflected in the social history.  The roster of all physicians providing medical care to patient - is listed in the Snapshot section of the chart.  Activities of daily living:  The patient is 100% independent in all ADLs: dressing, toileting, feeding as well as independent mobility  Home safety : The patient has smoke detectors in the home. They wear seatbelts.  There are no firearms at home. There is no violence in the home.   There is no risks for hepatitis, STDs or HIV. There is no   history of blood transfusion. They have no travel history to infectious disease endemic areas of the world.  The patient has seen their dentist in the last six month. They have seen their eye doctor in the last year. They deny any hearing difficulty with regard to whispered voices and some television programs.  They have deferred audiologic testing in the last year.  They do not  have excessive sun exposure. Discussed the need for sun protection: hats, long sleeves and use of sunscreen if there is significant sun exposure.   Diet: the importance of a healthy diet is discussed. TShe has been adhering to the KETO diet and has lost over 20 lbs.  .  The benefits of regular aerobic exercise were discussed. She is walking 7 days per week , 45 minutes.   Depression screen: there are no signs or vegative symptoms of depression- irritability, change in appetite, anhedonia, sadness/tearfullness.  Cognitive assessment: the patient manages all their financial and personal affairs and is actively engaged. They could relate day,date,year and events; recalled 3/3 objects at 3 minutes; performed clock-face  test normally.  The following portions of the patient's history were reviewed and updated as appropriate: allergies, current medications, past family history, past medical history,  past surgical history, past social history  and problem list.  Visual acuity was not assessed per patient preference since she has regular follow up with her ophthalmologist. Hearing and body mass index were assessed and reviewed.   During the course of the visit the patient was educated and counseled about appropriate screening and preventive services including : fall prevention , diabetes screening, nutrition counseling, colorectal cancer screening, and recommended immunizations.    CC: The primary encounter diagnosis was Encounter for general adult medical examination with abnormal findings. Diagnoses of History of colon cancer, stage III, B12 deficiency, Other specified hypothyroidism, Vitamin D deficiency, Thrombocytopenia ([place]), Abnormal breast exam, and Screening for measles were also pertinent to this visit.  History Reeshemah has a past medical history of Colon cancer ([place]) (1997), DVT (deep venous thrombosis) ([place]), Encounter for preventive health examination (12/30/2014), Eustachian tube dysfunction (07/03/12), Hypertriglyceridemia without hypercholesterolemia, Hypothyroidism, Prediabetes (02/29/12), and Thyroid nodule (02/29/12).   She has a past surgical history that includes Tubal ligation (2001); Knee arthroscopy w/ ACL reconstruction (Left, 1990); Colon resection (1997); Tonsillectomy and adenoidectomy (1962); and Hernia repair (2002).   Her family history includes Diabetes in her son; Hyperlipidemia in her brother.She reports that she has never smoked. She has never used smokeless tobacco. She reports that she drinks alcohol. She reports that she does not use drugs.  Outpatient Medications Prior to Visit  Medication Sig Dispense Refill  . albuterol (PROVENTIL HFA;VENTOLIN HFA) 108 (90 Base) MCG/ACT inhaler  Inhale 2 puffs into the lungs every 6 (six) hours as needed for wheezing or shortness of breath. 1 Inhaler 11  . diphenoxylate-atropine (LOMOTIL) 2.5-0.025 MG tablet Take 1 tablet 4 (four) times daily as needed by mouth for diarrhea or loose stools. 30 tablet 0  . Multiple Vitamins-Minerals (MULTIVITAMIN ADULTS 50+ PO) Take 1 tablet by mouth daily.    Marland Kitchen thyroid (ARMOUR THYROID) 30 MG tablet Take 1 tablet (30 mg total) by mouth daily before breakfast. 90 tablet 0  . oseltamivir (TAMIFLU) 75 MG capsule Take 1 capsule (75 mg total) 2 (two) times daily by mouth. (Patient not taking: Reported on 10/23/2017) 10 capsule 0   No facility-administered medications prior to visit.     Review of Systems   Patient denies headache, fevers, malaise, unintentional weight loss, skin rash, eye pain, sinus congestion and sinus pain, sore throat, dysphagia,  hemoptysis , cough, dyspnea, wheezing, chest pain, palpitations, orthopnea, edema, abdominal pain, nausea, melena, diarrhea, constipation, flank pain, dysuria, hematuria, urinary  Frequency, nocturia, numbness, tingling, seizures,  Focal weakness, Loss of consciousness,  Tremor, insomnia, depression, anxiety, and suicidal ideation.      Objective:  BP 136/80 (BP Location: Left Arm, Patient Position: Sitting, Cuff Size: Normal)   Pulse (!) 103   Temp 98.3 F (36.8 C) (Oral)   Resp 15   Ht 5\' 8"  (1.727 m)   Wt 151 lb 9.6 oz (68.8 kg)   SpO2 96%   BMI 23.05 kg/m   Physical Exam  General appearance: alert, cooperative and appears stated age Head: Normocephalic, without obvious abnormality, atraumatic Eyes: conjunctivae/corneas clear. PERRL, EOM's intact. Fundi benign. Ears: normal TM's and external ear canals both ears Nose: Nares normal. Septum midline. Mucosa normal. No drainage or sinus tenderness. Throat: lips, mucosa, and tongue normal; teeth and gums normal Neck: no adenopathy, no carotid bruit, no JVD, supple, symmetrical, trachea midline and  thyroid not enlarged, symmetric, no tenderness/mass/nodules Lungs: clear to auscultation bilaterally Breasts: normal appearance, right breast with several grouped nodules at 7:00 position, left breast without masses Heart: regular rate and rhythm, S1, S2 normal, no murmur, click, rub or gallop Abdomen: soft, non-tender; bowel sounds normal; no masses,  no organomegaly Extremities: extremities normal, atraumatic, no cyanosis or edema Pulses: 2+ and symmetric Skin: Skin color, texture, turgor normal. No rashes or lesions Neurologic: Alert and oriented X 3, normal strength and tone. Normal symmetric reflexes. Normal coordination and gait.     Assessment & Plan:   Problem List Items Addressed This Visit    B12 deficiency   Relevant Orders   B12   RBC Folate   Thrombocytopenia (HCC)    Mild,  improving on repeat testing.  Will repeat in one month        Relevant Orders   CBC with Differential/Platelet (Completed)   Other specified hypothyroidism    Thyroid function is WNL on current dose.  No current changes needed.   Lab Results  Component Value Date   TSH 1.31 10/10/2017         Relevant Orders   T4, free   T3, free   T3, reverse   History of colon cancer, stage III    Dioagnosed and treated at age 75 with colonic resection and chemo per patient while residing in [place]. Current guidelines from all major groups recommend surveillance colonoscopy every 5 years , will recommend referral for colonoscopy  Relevant Orders   Ambulatory referral to General Surgery   Encounter for general adult medical examination with abnormal findings - Primary    Annual comprehensive preventive exam was done as well as an evaluation and management of chronic conditions .  Breast exam was abnormal, , but patient has lost 25 lbs since her last visit. Diagnostic mammogram has been ordered. During the course of the visit the patient was educated and counseled about appropriate screening and  preventive services including :  diabetes screening, lipid analysis with projected  10 year  risk for CAD , nutrition counseling, cervical and colorectal cancer screening, and recommended immunizations.  Printed recommendations for health maintenance screenings was given       Other Visit Diagnoses    Vitamin D deficiency       Relevant Orders   VITAMIN D 25 Hydroxy (Vit-D Deficiency, Fractures)   Abnormal breast exam       Relevant Orders   MM Digital Diagnostic Bilat   Screening for measles       Relevant Orders   Measles ([place]) Antibody IgG (Completed)      I have discontinued Brelyn K. Resh's oseltamivir. I am also having her maintain her Multiple Vitamins-Minerals (MULTIVITAMIN ADULTS 50+ PO), albuterol, diphenoxylate-atropine, and thyroid.  No orders of the defined types were placed in this encounter.   Medications Discontinued During This Encounter  Medication Reason  . oseltamivir (TAMIFLU) 75 MG capsule Patient has not taken in last 30 days    Follow-up: Return in about 1 year (around 10/24/2018).   Crecencio Mc, MD

## 2017-10-23 NOTE — Patient Instructions (Signed)
Diagnostic mammogram ordered   Additional labs today  Including measles titer ,  thryoid d, and repeat platelet count  GI referral

## 2017-10-23 NOTE — Assessment & Plan Note (Addendum)
Dioagnosed and treated at age 62 with colonic resection and chemo per patient while residing in [place]. Current guidelines from all major groups recommend surveillance colonoscopy every 5 years , will recommend referral for colonoscopy

## 2017-10-24 ENCOUNTER — Encounter: Payer: Self-pay | Admitting: Internal Medicine

## 2017-10-24 DIAGNOSIS — D696 Thrombocytopenia, unspecified: Secondary | ICD-10-CM | POA: Insufficient documentation

## 2017-10-24 LAB — RUBEOLA ANTIBODY IGG: Rubeola IgG: >300 AU/mL

## 2017-10-24 NOTE — Assessment & Plan Note (Signed)
Thyroid function is WNL on current dose.  No current changes needed.   Lab Results  Component Value Date   TSH 1.31 10/10/2017

## 2017-10-24 NOTE — Assessment & Plan Note (Signed)
Annual comprehensive preventive exam was done as well as an evaluation and management of chronic conditions .  Breast exam was abnormal, , but patient has lost 25 lbs since her last visit. Diagnostic mammogram has been ordered. During the course of the visit the patient was educated and counseled about appropriate screening and preventive services including :  diabetes screening, lipid analysis with projected  10 year  risk for CAD , nutrition counseling, cervical and colorectal cancer screening, and recommended immunizations.  Printed recommendations for health maintenance screenings was given

## 2017-10-24 NOTE — Assessment & Plan Note (Addendum)
Mild,  improving on repeat testing.  Will repeat in one month

## 2017-10-25 ENCOUNTER — Encounter: Payer: Self-pay | Admitting: *Deleted

## 2017-10-28 LAB — TEST AUTHORIZATION

## 2017-10-28 LAB — T3, REVERSE: T3, Reverse: 18 ng/dL (ref 8–25)

## 2017-10-28 LAB — VITAMIN D, 25-OH,TOTAL,IA(REFL): VIT D 25 HYDROXY: 51 ng/mL (ref 30–100)

## 2017-10-28 LAB — T4, FREE: Free T4: 1.3 ng/dL (ref 0.8–1.8)

## 2017-10-28 LAB — FOLATE RBC: RBC Folate: 717 ng/mL RBC (ref >280)

## 2017-10-28 LAB — T3, FREE: T3, Free: 3 pg/mL (ref 2.3–4.2)

## 2017-10-28 LAB — VITAMIN B12: Vitamin B-12: 493 pg/mL (ref 200–1100)

## 2017-10-30 ENCOUNTER — Other Ambulatory Visit: Payer: Self-pay | Admitting: Internal Medicine

## 2017-10-30 DIAGNOSIS — N6310 Unspecified lump in the right breast, unspecified quadrant: Secondary | ICD-10-CM

## 2017-10-30 DIAGNOSIS — N6459 Other signs and symptoms in breast: Secondary | ICD-10-CM

## 2017-11-07 ENCOUNTER — Ambulatory Visit
Admission: RE | Admit: 2017-11-07 | Discharge: 2017-11-07 | Disposition: A | Discharge status: Home / Self Care | Payer: BLUE CROSS/BLUE SHIELD | Source: Ambulatory Visit | Attending: Internal Medicine | Admitting: Internal Medicine

## 2017-11-07 DIAGNOSIS — N6459 Other signs and symptoms in breast: Secondary | ICD-10-CM

## 2017-11-07 DIAGNOSIS — N6310 Unspecified lump in the right breast, unspecified quadrant: Secondary | ICD-10-CM

## 2017-11-07 DIAGNOSIS — N6489 Other specified disorders of breast: Secondary | ICD-10-CM | POA: Diagnosis not present

## 2017-11-07 DIAGNOSIS — R928 Other abnormal and inconclusive findings on diagnostic imaging of breast: Secondary | ICD-10-CM | POA: Diagnosis not present

## 2017-11-10 ENCOUNTER — Other Ambulatory Visit: Payer: Self-pay | Admitting: Internal Medicine

## 2017-11-22 ENCOUNTER — Encounter: Payer: Self-pay | Admitting: Internal Medicine

## 2017-11-22 MED ORDER — TIZANIDINE HCL 2 MG PO TABS: 2.0000 mg | ORAL_TABLET | Freq: Four times a day (QID) | ORAL | 1 refills | Status: DC | PRN

## 2017-11-22 MED ORDER — NAPROXEN 500 MG PO TABS: 500.0000 mg | ORAL_TABLET | Freq: Two times a day (BID) | ORAL | 2 refills | Status: DC

## 2017-11-28 ENCOUNTER — Ambulatory Visit (INDEPENDENT_AMBULATORY_CARE_PROVIDER_SITE_OTHER): Payer: BLUE CROSS/BLUE SHIELD | Admitting: General Surgery

## 2017-11-28 ENCOUNTER — Encounter: Payer: Self-pay | Admitting: General Surgery

## 2017-11-28 VITALS — BP 142/84 | HR 87 | Resp 14 | Ht 68.0 in | Wt 150.0 lb

## 2017-11-28 DIAGNOSIS — K591 Functional diarrhea: Secondary | ICD-10-CM

## 2017-11-28 DIAGNOSIS — Z85038 Personal history of other malignant neoplasm of large intestine: Secondary | ICD-10-CM | POA: Diagnosis not present

## 2017-11-28 MED ORDER — CHOLESTYRAMINE LIGHT 4 G PO PACK: 4.0000 g | PACK | Freq: Every day | ORAL | 0 refills | Status: DC

## 2017-11-28 NOTE — Patient Instructions (Addendum)
Questran trial after colonoscopy   Colonoscopy, Adult A colonoscopy is an exam to look at the entire large intestine. During the exam, a lubricated, bendable tube is inserted into the anus and then passed into the rectum, colon, and other parts of the large intestine. A colonoscopy is often done as a part of normal colorectal screening or in response to certain symptoms, such as anemia, persistent diarrhea, abdominal pain, and blood in the stool. The exam can help screen for and diagnose medical problems, including:  Tumors.  Polyps.  Inflammation.  Areas of bleeding.  Tell a health care provider about:  Any allergies you have.  All medicines you are taking, including vitamins, herbs, eye drops, creams, and over-the-counter medicines.  Any problems you or family members have had with anesthetic medicines.  Any blood disorders you have.  Any surgeries you have had.  Any medical conditions you have.  Any problems you have had passing stool. What are the risks? Generally, this is a safe procedure. However, problems may occur, including:  Bleeding.  A tear in the intestine.  A reaction to medicines given during the exam.  Infection (rare).  What happens before the procedure? Eating and drinking restrictions Follow instructions from your health care provider about eating and drinking, which may include:  A few days before the procedure - follow a low-fiber diet. Avoid nuts, seeds, dried fruit, raw fruits, and vegetables.  1-3 days before the procedure - follow a clear liquid diet. Drink only clear liquids, such as clear broth or bouillon, black coffee or tea, clear juice, clear soft drinks or sports drinks, gelatin dessert, and popsicles. Avoid any liquids that contain red or purple dye.  On the day of the procedure - do not eat or drink anything during the 2 hours before the procedure, or within the time period that your health care provider recommends.  Bowel prep If  you were prescribed an oral bowel prep to clean out your colon:  Take it as told by your health care provider. Starting the day before your procedure, you will need to drink a large amount of medicated liquid. The liquid will cause you to have multiple loose stools until your stool is almost clear or light green.  If your skin or anus gets irritated from diarrhea, you may use these to relieve the irritation: ? Medicated wipes, such as adult wet wipes with aloe and vitamin E. ? A skin soothing-product like petroleum jelly.  If you vomit while drinking the bowel prep, take a break for up to 60 minutes and then begin the bowel prep again. If vomiting continues and you cannot take the bowel prep without vomiting, call your health care provider.  General instructions  Ask your health care provider about changing or stopping your regular medicines. This is especially important if you are taking diabetes medicines or blood thinners.  Plan to have someone take you home from the hospital or clinic. What happens during the procedure?  An IV tube may be inserted into one of your veins.  You will be given medicine to help you relax (sedative).  To reduce your risk of infection: ? Your health care team will wash or sanitize their hands. ? Your anal area will be washed with soap.  You will be asked to lie on your side with your knees bent.  Your health care provider will lubricate a long, thin, flexible tube. The tube will have a camera and a light on the end.  The  tube will be inserted into your anus.  The tube will be gently eased through your rectum and colon.  Air will be delivered into your colon to keep it open. You may feel some pressure or cramping.  The camera will be used to take images during the procedure.  A small tissue sample may be removed from your body to be examined under a microscope (biopsy). If any potential problems are found, the tissue will be sent to a lab for  testing.  If small polyps are found, your health care provider may remove them and have them checked for cancer cells.  The tube that was inserted into your anus will be slowly removed. The procedure may vary among health care providers and hospitals. What happens after the procedure?  Your blood pressure, heart rate, breathing rate, and blood oxygen level will be monitored until the medicines you were given have worn off.  Do not drive for 24 hours after the exam.  You may have a small amount of blood in your stool.  You may pass gas and have mild abdominal cramping or bloating due to the air that was used to inflate your colon during the exam.  It is up to you to get the results of your procedure. Ask your health care provider, or the department performing the procedure, when your results will be ready. This information is not intended to replace advice given to you by your health care provider. Make sure you discuss any questions you have with your health care provider. Document Released: 05/04/2000 Document Revised: 03/07/2016 Document Reviewed: 07/19/2015 Elsevier Interactive Patient Education  2018 [employer].

## 2017-11-28 NOTE — Progress Notes (Signed)
ID: Carolyn Bass, female   DOB: [birth year], 62 y.o.   MRN: [id number]  Chief Complaint  Patient presents with  . Colonoscopy    HPI Carolyn Bass is a 62 y.o. female here today for a evaluation of a colonoscopy. Last colonoscopy was five years ago in [place] . Patient states no new GI problems at this time.  The patient reports she eats a vegetarian diet.  She states iceburg lettuce and beets cause more liquid stools. Bowels move 3-4 times a day and loose, formed first thing in the morning. She states this is her normal since her colon resection, 1997. Denies bloody stools. She moved here from [place] 4 years ago.  The patient is retired from the [employer] world.  She reports that loose stools are more common with stress, improved since retirement.     She was diagnosed with colon cancer at age 58.  No genetic testing is been completed.  She has no children. HPI  Past Medical History:  Diagnosis Date  . Allergy    seasonal  . Colon cancer ([place]) 1997   s/p resection, chemo  . Colon polyp   . DVT (deep venous thrombosis) ([facility])    2/2 BCP  . Encounter for preventive health examination 12/30/2014  . Eustachian tube dysfunction 07/03/12  . Hypertriglyceridemia without hypercholesterolemia   . Hypothyroidism    On Armour. Did not tolerate synthroid  . Prediabetes 02/29/12  . Thyroid nodule 02/29/12    Past Surgical History:  Procedure Laterality Date  . COLON RESECTION  1997  . HERNIA REPAIR  2002  . KNEE ARTHROSCOPY W/ ACL RECONSTRUCTION Left 1990  . [place]  . TUBAL LIGATION  2001    Family History  Problem Relation Age of Onset  . Hyperlipidemia Brother   . Diabetes Son     Social History Social History   Tobacco Use  . Smoking status: Never Smoker  . Smokeless tobacco: Never Used  Substance Use Topics  . Alcohol use: Yes    Comment: 1 drink weekly  . Drug use: No    Allergies  Allergen Reactions  . Synthroid [Levothyroxine  Sodium]     Brittle nails, fatigue, dry skin  . Penicillins Rash  . Tetracyclines & Related Rash    Current Outpatient Medications  Medication Sig Dispense Refill  . albuterol (PROVENTIL HFA;VENTOLIN HFA) 108 (90 Base) MCG/ACT inhaler Inhale 2 puffs into the lungs every 6 (six) hours as needed for wheezing or shortness of breath. 1 Inhaler 11  . ARMOUR THYROID 30 MG tablet TAKE 1 TABLET BY MOUTH DAILY BEFORE BREAKFAST 90 tablet 1  . diphenoxylate-atropine (LOMOTIL) 2.5-0.025 MG tablet Take 1 tablet 4 (four) times daily as needed by mouth for diarrhea or loose stools. 30 tablet 0  . Multiple Vitamins-Minerals (MULTIVITAMIN ADULTS 50+ PO) Take 1 tablet by mouth daily.    . naproxen (NAPROSYN) 500 MG tablet Take 1 tablet (500 mg total) by mouth 2 (two) times daily with a meal. 60 tablet 2  . tiZANidine (ZANAFLEX) 2 MG tablet Take 1 tablet (2 mg total) by mouth every 6 (six) hours as needed for muscle spasms. 60 tablet 1  . cholestyramine light (PREVALITE) 4 g packet Take 1 packet (4 g total) by mouth daily. 30 packet 0   No current facility-administered medications for this visit.     Review of Systems Review of Systems  Constitutional: Negative.   Respiratory: Negative.   Cardiovascular: Negative.   Gastrointestinal:  Positive for diarrhea. Negative for abdominal pain.    Blood pressure (!) 142/84, pulse 87, resp. rate 14, height 5\' 8"  (1.727 m), weight 150 lb (68 kg).  Physical Exam Physical Exam  Constitutional: She is oriented to person, place, and time. She appears well-developed and well-nourished.  HENT:  Mouth/Throat: Oropharynx is clear and moist.  Eyes: No scleral icterus.  Neck: Neck supple.  Cardiovascular: Normal rate, regular rhythm and normal heart sounds.  Pulmonary/Chest: Effort normal and breath sounds normal.  Abdominal: Soft.  Midline incision  Neurological: She is alert and oriented to person, place, and time.  Skin: Skin is warm and dry.  Psychiatric: Her  behavior is normal.    Data Reviewed CBC dated October 23, 2017 showed a hemoglobin of 14.1 with an MCV of 86, white blood cell count of 5900.  Platelet count borderline low at 145,000, unchanged from earlier this year, down from 171,000 in January 2018. Comprehensive metabolic panel of Oct 10, 2017 was entirely normal.  Estimated GFR of 82.  Assessment     Candidate for screening colonoscopy.  Loose stools possibly associated with bile salt malabsorption.    Plan  The patient reports that he took a foot of her terminal ileum, which would certainly be more than usual for a right colectomy, and this may be impairing her bile salt absorption.  While she does not have watery stool with each bowel movement, it seems reasonable to make use of a trial of Questran 1 packet daily.  We should know within a couple of weeks whether this again make a difference.  As she is going to postpone colonoscopy until fall, she can start the Questran trial at any time.     Colonoscopy with possible biopsy/polypectomy prn: Information regarding the procedure, including its potential risks and complications (including but not limited to perforation of the bowel, which may require emergency surgery to repair, and bleeding) was verbally given to the patient. Educational information regarding lower intestinal endoscopy was given to the patient. Written instructions for how to complete the bowel prep using Miralax were provided. The importance of drinking ample fluids to avoid dehydration as a result of the prep emphasized.   HPI, Physical Exam, Assessment and Plan have been scribed under the direction and in the presence of Robert Bellow, MD. Karie Fetch, RN  I have completed the exam and reviewed the above documentation for accuracy and completeness.  I agree with the above.  Haematologist has been used and any errors in dictation or transcription are unintentional.  [name], M.D., F.A.C.S.  Carolyn Bass 11/29/2017, 6:20 AM

## 2017-11-29 DIAGNOSIS — K591 Functional diarrhea: Secondary | ICD-10-CM | POA: Insufficient documentation

## 2017-12-23 ENCOUNTER — Telehealth: Payer: Self-pay

## 2017-12-23 MED ORDER — POLYETHYLENE GLYCOL 3350 17 GM/SCOOP PO POWD: 1.0000 | Freq: Once | ORAL | 0 refills | Status: AC

## 2017-12-23 NOTE — Telephone Encounter (Signed)
Call to patient to let her know that the September and October surgery schedule was open for her to schedule her colonoscopy.  The patient is scheduled for a Colonoscopy at [facility] on 02/26/18. They are aware to call the day before to get their arrival time. Miralax prescription has been sent into the patient's pharmacy. The patient is aware of date and instructions.  The patient will call back to review colonoscopy instructions.

## 2017-12-26 ENCOUNTER — Other Ambulatory Visit: Payer: Self-pay | Admitting: General Surgery

## 2017-12-26 DIAGNOSIS — Z85038 Personal history of other malignant neoplasm of large intestine: Secondary | ICD-10-CM

## 2018-01-15 ENCOUNTER — Other Ambulatory Visit: Payer: Self-pay | Admitting: Internal Medicine

## 2018-01-15 MED ORDER — PSEUDOEPHEDRINE-CODEINE-GG 30-10-100 MG/5ML PO SOLN: 10.0000 mL | Freq: Four times a day (QID) | ORAL | 0 refills | Status: DC | PRN

## 2018-01-15 MED ORDER — PREDNISONE 10 MG PO TABS: ORAL_TABLET | ORAL | 0 refills | Status: DC

## 2018-02-26 ENCOUNTER — Encounter
Admission: RE | Disposition: A | Discharge status: Home / Self Care | Payer: Self-pay | Source: Ambulatory Visit | Attending: General Surgery

## 2018-02-26 ENCOUNTER — Ambulatory Visit: Payer: BLUE CROSS/BLUE SHIELD | Admitting: Anesthesiology

## 2018-02-26 ENCOUNTER — Ambulatory Visit
Admission: RE | Admit: 2018-02-26 | Discharge: 2018-02-26 | Disposition: A | Discharge status: Home / Self Care | Payer: BLUE CROSS/BLUE SHIELD | Source: Ambulatory Visit | Attending: General Surgery | Admitting: General Surgery

## 2018-02-26 ENCOUNTER — Encounter: Payer: Self-pay | Admitting: *Deleted

## 2018-02-26 DIAGNOSIS — Z86718 Personal history of other venous thrombosis and embolism: Secondary | ICD-10-CM | POA: Diagnosis not present

## 2018-02-26 DIAGNOSIS — E039 Hypothyroidism, unspecified: Secondary | ICD-10-CM | POA: Insufficient documentation

## 2018-02-26 DIAGNOSIS — Z980 Intestinal bypass and anastomosis status: Secondary | ICD-10-CM | POA: Diagnosis not present

## 2018-02-26 DIAGNOSIS — Z9221 Personal history of antineoplastic chemotherapy: Secondary | ICD-10-CM | POA: Insufficient documentation

## 2018-02-26 DIAGNOSIS — Z9049 Acquired absence of other specified parts of digestive tract: Secondary | ICD-10-CM | POA: Insufficient documentation

## 2018-02-26 DIAGNOSIS — Z85038 Personal history of other malignant neoplasm of large intestine: Secondary | ICD-10-CM

## 2018-02-26 DIAGNOSIS — Z1211 Encounter for screening for malignant neoplasm of colon: Secondary | ICD-10-CM | POA: Insufficient documentation

## 2018-02-26 HISTORY — PX: COLONOSCOPY WITH PROPOFOL: SHX5780

## 2018-02-26 SURGERY — COLONOSCOPY WITH PROPOFOL
Anesthesia: General

## 2018-02-26 MED ORDER — LIDOCAINE HCL (CARDIAC) PF 100 MG/5ML IV SOSY
PREFILLED_SYRINGE | INTRAVENOUS | Status: DC | PRN
  Administered 2018-02-26: 60 mg via INTRAVENOUS

## 2018-02-26 MED ORDER — LIDOCAINE HCL (PF) 2 % IJ SOLN
INTRAMUSCULAR | Status: AC
  Filled 2018-02-26: qty 10

## 2018-02-26 MED ORDER — SODIUM CHLORIDE 0.9 % IV SOLN
INTRAVENOUS | Status: DC
  Administered 2018-02-26: 1000 mL via INTRAVENOUS

## 2018-02-26 MED ORDER — PROPOFOL 10 MG/ML IV BOLUS
INTRAVENOUS | Status: DC | PRN
  Administered 2018-02-26: 10 mg via INTRAVENOUS
  Administered 2018-02-26: 50 mg via INTRAVENOUS
  Administered 2018-02-26: 10 mg via INTRAVENOUS
  Administered 2018-02-26: 20 mg via INTRAVENOUS

## 2018-02-26 MED ORDER — PROPOFOL 10 MG/ML IV BOLUS
INTRAVENOUS | Status: AC
  Filled 2018-02-26: qty 20

## 2018-02-26 MED ORDER — PHENYLEPHRINE HCL 10 MG/ML IJ SOLN
INTRAMUSCULAR | Status: AC
  Filled 2018-02-26: qty 1

## 2018-02-26 MED ORDER — PROPOFOL 500 MG/50ML IV EMUL
INTRAVENOUS | Status: AC
  Filled 2018-02-26: qty 50

## 2018-02-26 MED ORDER — PROPOFOL 500 MG/50ML IV EMUL
INTRAVENOUS | Status: DC | PRN
  Administered 2018-02-26: 125 ug/kg/min via INTRAVENOUS

## 2018-02-26 NOTE — H&P (Addendum)
Carolyn Bass [id number] [birth year]     HPI:  Healthy 62 y/o woman with personal history of right colon cancer. Tolerated prep well. For colonoscopy.   Medications Prior to Admission  Medication Sig Dispense Refill Last Dose  . albuterol (PROVENTIL HFA;VENTOLIN HFA) 108 (90 Base) MCG/ACT inhaler Inhale 2 puffs into the lungs every 6 (six) hours as needed for wheezing or shortness of breath. 1 Inhaler 11 Taking  . ARMOUR THYROID 30 MG tablet TAKE 1 TABLET BY MOUTH DAILY BEFORE BREAKFAST 90 tablet 1 Taking  . cholestyramine light (PREVALITE) 4 g packet Take 1 packet (4 g total) by mouth daily. 30 packet 0   . diphenoxylate-atropine (LOMOTIL) 2.5-0.025 MG tablet Take 1 tablet 4 (four) times daily as needed by mouth for diarrhea or loose stools. 30 tablet 0 Taking  . Multiple Vitamins-Minerals (MULTIVITAMIN ADULTS 50+ PO) Take 1 tablet by mouth daily.   Taking  . naproxen (NAPROSYN) 500 MG tablet Take 1 tablet (500 mg total) by mouth 2 (two) times daily with a meal. 60 tablet 2 Taking  . pseudoephedrine-codeine-guaifenesin (MYTUSSIN DAC) 30-10-100 MG/5ML solution Take 10 mLs by mouth 4 (four) times daily as needed for cough. 120 mL 0   . tiZANidine (ZANAFLEX) 2 MG tablet Take 1 tablet (2 mg total) by mouth every 6 (six) hours as needed for muscle spasms. 60 tablet 1 Taking   Allergies  Allergen Reactions  . Synthroid [Levothyroxine Sodium]     Brittle nails, fatigue, dry skin  . Penicillins Rash  . Tetracyclines & Related Rash   Past Medical History:  Diagnosis Date  . Allergy    seasonal  . Colon cancer ([place]) 1997   s/p resection, chemo  . Colon polyp   . DVT (deep venous thrombosis) ([facility])    2/2 BCP  . Encounter for preventive health examination 12/30/2014  . Eustachian tube dysfunction 07/03/12  . Hypertriglyceridemia without hypercholesterolemia   . Hypothyroidism    On Armour. Did not tolerate synthroid  . Prediabetes 02/29/12  . Thyroid nodule 02/29/12   Past Surgical  History:  Procedure Laterality Date  . COLON RESECTION  1997  . HERNIA REPAIR  2002  . KNEE ARTHROSCOPY W/ ACL RECONSTRUCTION Left 1990  . [place]  . TUBAL LIGATION  2001   Social History   Socioeconomic History  . Marital status: Single    Spouse name: Not on file  . Number of children: 0  . Years of education: 74  . Highest education level: Not on file  Occupational History  . 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 Financial resource strain: Not on file  . Food insecurity:    Worry: Not on file    Inability: Not on file  . Transportation needs:    Medical: Not on file    Non-medical: Not on file  Tobacco Use  . Smoking status: Never Smoker  . Smokeless tobacco: Never Used  Substance and Sexual Activity  . Alcohol use: Yes    Comment: 1 drink weekly  . Drug use: No  . Sexual activity: Not on file  Lifestyle  . Physical activity:    Days per week: Not on file    Minutes per session: Not on file  . Stress: Not on file  Relationships  . Social connections:    Talks on phone: Not on file    Gets together: Not on file    Attends religious service: Not  on file    Active member of club or organization: Not on file    Attends meetings of clubs or organizations: Not on file    Relationship status: Not on file  . Intimate partner violence:    Fear of current or ex partner: Not on file    Emotionally abused: Not on file    Physically abused: Not on file    Forced sexual activity: Not on file  Other Topics Concern  . Not on file  Social History Narrative   Living in [place]. Divorced. Moved from [place] to this area in April 2015. She has an Loss adjuster, chartered. Retired from Tour manager.      Hobbies: cross country ski, gardening, travel, reading, sewing   Caffeine: tea 2 cups daily   Exercise: [employer]. Workouts 4x/week - cardio, weight training   Diet: pescetarian   Social History   Social History  Narrative   Living in [place]. Divorced. Moved from [place] to this area in April 2015. She has an Loss adjuster, chartered. Retired from Tour manager.      Hobbies: cross country ski, gardening, travel, reading, sewing   Caffeine: tea 2 cups daily   Exercise: [employer]. Workouts 4x/week - cardio, weight training   Diet: pescetarian     ROS: Negative.     PE: HEENT: Negative. Lungs: Clear. Cardio: RR.  Assessment/Plan:  Proceed with planned endoscopy.  Forest Gleason Byrnett 02/26/2018

## 2018-02-26 NOTE — Anesthesia Preprocedure Evaluation (Addendum)
Anesthesia Evaluation  Patient identified by MRN, date of birth, ID band Patient awake    Reviewed: Allergy & Precautions, H&P , NPO status , Patient's Chart, lab work & pertinent test results  History of Anesthesia Complications Negative for: history of anesthetic complications  Airway Mallampati: II  TM Distance: >3 FB Neck ROM: full    Dental  (+) Teeth Intact Permanent bridge:   Pulmonary neg pulmonary ROS,    breath sounds clear to auscultation       Cardiovascular negative cardio ROS   Rhythm:regular Rate:Normal     Neuro/Psych negative neurological ROS  negative psych ROS   GI/Hepatic Neg liver ROS, H/o colon cancer 1997 s/p resection and chemo   Endo/Other  Hypothyroidism   Renal/GU negative Renal ROS  negative genitourinary   Musculoskeletal   Abdominal   Peds  Hematology negative hematology ROS (+)   Anesthesia Other Findings Past Medical History: No date: Allergy     Comment:  seasonal 1997: Colon cancer ([place])     Comment:  s/p resection, chemo No date: Colon polyp No date: DVT (deep venous thrombosis) ([facility])     Comment:  2/2 BCP 12/30/2014: Encounter for preventive health examination 07/03/12: Eustachian tube dysfunction No date: Hypertriglyceridemia without hypercholesterolemia No date: Hypothyroidism     Comment:  On Armour. Did not tolerate synthroid 02/29/12: Prediabetes 02/29/12: Thyroid nodule  Past Surgical History: 1997: COLON RESECTION 2002: HERNIA REPAIR 1990: KNEE ARTHROSCOPY W/ ACL RECONSTRUCTION; Left 1962: TONSILLECTOMY AND ADENOIDECTOMY 2001: TUBAL LIGATION     Reproductive/Obstetrics negative OB ROS                            Anesthesia Physical Anesthesia Plan  ASA: II  Anesthesia Plan: General   Post-op Pain Management:    Induction:   PONV Risk Score and Plan: Propofol infusion and TIVA  Airway Management Planned:   Additional  Equipment:   Intra-op Plan:   Post-operative Plan:   Informed Consent: I have reviewed the patients History and Physical, chart, labs and discussed the procedure including the risks, benefits and alternatives for the proposed anesthesia with the patient or authorized representative who has indicated his/her understanding and acceptance.   Dental Advisory Given  Plan Discussed with: Anesthesiologist, CRNA and Surgeon  Anesthesia Plan Comments:         Anesthesia Quick Evaluation

## 2018-02-26 NOTE — Anesthesia Postprocedure Evaluation (Signed)
Anesthesia Post Note  Patient: Carolyn Bass  Procedure(s) Performed: COLONOSCOPY WITH PROPOFOL (N/A )  Patient location during evaluation: PACU Anesthesia Type: General Level of consciousness: awake and alert Pain management: pain level controlled Vital Signs Assessment: post-procedure vital signs reviewed and stable Respiratory status: spontaneous breathing, nonlabored ventilation and respiratory function stable Cardiovascular status: blood pressure returned to baseline and stable Postop Assessment: no apparent nausea or vomiting Anesthetic complications: no     Last Vitals:  Vitals:   02/26/18 0813 02/26/18 0823  BP: 120/81 123/75  Pulse: 67 65  Resp: 11 13  Temp:    SpO2: 100% 99%    Last Pain:  Vitals:   02/26/18 0823  TempSrc:   PainSc: 0-No pain                 Durenda Hurt

## 2018-02-26 NOTE — Op Note (Signed)
[facility]: [facility] Patient Name: Carolyn Bass Procedure Date: 02/26/2018 7:37 AM MRN: [id number] Account #: [id number] Date of Birth: [birth year] Admit Type: Outpatient Age: 62 Room: [facility] ENDO ROOM 1 Gender: Female Note Status: Finalized Procedure:            Colonoscopy Indications:          High risk colon cancer surveillance: Personal history                        of colon cancer Providers:            Robert Bellow, MD Referring MD:         Deborra Medina, MD (Referring MD) Medicines:            Monitored Anesthesia Care Complications:        No immediate complications. Procedure:            Pre-Anesthesia Assessment:                       - Prior to the procedure, a History and Physical was                        performed, and patient medications, allergies and                        sensitivities were reviewed. The patient's tolerance of                        previous anesthesia was reviewed.                       - The risks and benefits of the procedure and the                        sedation options and risks were discussed with the                        patient. All questions were answered and informed                        consent was obtained.                       After obtaining informed consent, the colonoscope was                        passed under direct vision. Throughout the procedure,                        the patient's blood pressure, pulse, and oxygen                        saturations were monitored continuously. The                        Colonoscope was introduced through the anus and                        advanced to the the ileocolonic anastomosis. The  colonoscopy was performed without difficulty. The                        patient tolerated the procedure well. The quality of                        the bowel preparation was excellent. Findings:      The entire examined colon appeared normal on  direct and retroflexion       views. Impression:           - The entire examined colon is normal on direct and                        retroflexion views.                       - No specimens collected. Recommendation:       - Repeat colonoscopy in 5 years for surveillance. Procedure Code(s):    --- Professional ---                       [phone], Colonoscopy, flexible; diagnostic, including                        collection of specimen(s) by brushing or washing, when                        performed (separate procedure) Diagnosis Code(s):    --- Professional ---                       H88.502, Personal history of other malignant neoplasm                        of large intestine CPT copyright 2018 American Medical Association. All rights reserved. The codes documented in this report are preliminary and upon coder review may  be revised to meet current compliance requirements. Robert Bellow, MD 02/26/2018 8:02:06 AM This report has been signed electronically. Number of Addenda: 0 Note Initiated On: 02/26/2018 7:37 AM Scope Withdrawal Time: 0 hours 7 minutes 10 seconds  Total Procedure Duration: 0 hours 13 minutes 49 seconds       [facility]

## 2018-02-26 NOTE — Transfer of Care (Signed)
Immediate Anesthesia Transfer of Care Note  Patient: Carolyn Bass  Procedure(s) Performed: COLONOSCOPY WITH PROPOFOL (N/A )  Patient Location: PACU  Anesthesia Type:General  Level of Consciousness: sedated  Airway & Oxygen Therapy: Patient Spontanous Breathing and Patient connected to nasal cannula oxygen  Post-op Assessment: Report given to RN  Post vital signs: Reviewed and stable  Last Vitals:  Vitals Value Taken Time  BP 116/79 02/26/2018  8:05 AM  Temp 36.1 C 02/26/2018  8:03 AM  Pulse 69 02/26/2018  8:06 AM  Resp 14 02/26/2018  8:06 AM  SpO2 99 % 02/26/2018  8:06 AM  Vitals shown include unvalidated device data.  Last Pain:  Vitals:   02/26/18 0803  TempSrc: Tympanic  PainSc: Asleep         Complications: No apparent anesthesia complications

## 2018-02-26 NOTE — Anesthesia Post-op Follow-up Note (Signed)
Anesthesia QCDR form completed.        

## 2018-02-28 ENCOUNTER — Encounter: Payer: Self-pay | Admitting: General Surgery

## 2018-04-24 DIAGNOSIS — L814 Other melanin hyperpigmentation: Secondary | ICD-10-CM | POA: Diagnosis not present

## 2018-04-24 DIAGNOSIS — L821 Other seborrheic keratosis: Secondary | ICD-10-CM | POA: Diagnosis not present

## 2018-04-24 DIAGNOSIS — Z1283 Encounter for screening for malignant neoplasm of skin: Secondary | ICD-10-CM | POA: Diagnosis not present

## 2018-04-24 DIAGNOSIS — L720 Epidermal cyst: Secondary | ICD-10-CM | POA: Diagnosis not present

## 2018-04-25 ENCOUNTER — Other Ambulatory Visit: Payer: Self-pay | Admitting: Internal Medicine

## 2018-04-29 ENCOUNTER — Other Ambulatory Visit: Payer: Self-pay | Admitting: Internal Medicine

## 2018-04-29 MED ORDER — LEVOFLOXACIN 500 MG PO TABS: 500.0000 mg | ORAL_TABLET | Freq: Every day | ORAL | 0 refills | Status: DC

## 2018-04-29 MED ORDER — MOMETASONE FUROATE 50 MCG/ACT NA SUSP: 2.0000 | Freq: Every day | NASAL | 12 refills | Status: DC

## 2018-05-06 ENCOUNTER — Encounter: Payer: Self-pay | Admitting: Internal Medicine

## 2018-05-06 ENCOUNTER — Telehealth: Payer: Self-pay

## 2018-05-06 ENCOUNTER — Ambulatory Visit: Payer: BLUE CROSS/BLUE SHIELD | Admitting: Internal Medicine

## 2018-05-06 VITALS — BP 122/76 | HR 94 | Temp 98.3°F | Wt 163.0 lb

## 2018-05-06 DIAGNOSIS — J0191 Acute recurrent sinusitis, unspecified: Secondary | ICD-10-CM | POA: Diagnosis not present

## 2018-05-06 DIAGNOSIS — J0110 Acute frontal sinusitis, unspecified: Secondary | ICD-10-CM

## 2018-05-06 MED ORDER — METHYLPREDNISOLONE ACETATE 40 MG/ML IJ SUSP
40.0000 mg | Freq: Once | INTRAMUSCULAR | Status: AC
  Administered 2018-05-06: 40 mg via INTRAMUSCULAR

## 2018-05-06 MED ORDER — PREDNISONE 20 MG PO TABS: 20.0000 mg | ORAL_TABLET | Freq: Every day | ORAL | 0 refills | Status: DC

## 2018-05-06 NOTE — Telephone Encounter (Signed)
Jamel with [facility] called to verify Prednisone prescription.

## 2018-05-06 NOTE — Patient Instructions (Signed)
Continue levaquin  continue pseudophedrine during the day,  Afrin at night   continue benadryl 30 mintues before bedtime    Prednisone 20 mg daily starting tomorrow   You can suspend the nasonex while on the prednisone   Resume a daily antihistamine   Tylenol with codeine at night for cough ( or cough medicine it it doesn't have pseudoephedrine)

## 2018-05-06 NOTE — Progress Notes (Signed)
Subjective:  Patient ID: Carolyn Bass, female    DOB: 1955/11/08  Age: 62 y.o. MRN: [id number]  CC: The primary encounter diagnosis was Acute recurrent sinusitis, unspecified location. A diagnosis of Acute non-recurrent frontal sinusitis was also pertinent to this visit.  HPI Carolyn Bass presents for evaluation  of persistent fullness in the ears and sinus pressure.  She was treated empirically for sinusitis  On Dec 10 after sympotms failed to resolve with oTC meds.  Took the levaquin  symptoms started prior to thanksgiving after a sick contact.  Saturday before thanksgiving   Developed significantly sore throat, sinus congestion,  Mild sweats, achey,  after driving to [place] (12 hours ) .  symptoms waxed and waned .  Tried sudafed,  flonase ,  No improvement ,  Couldn't take it at night, Started levaquin  Dec 13 th after nasonex failed to resolve symptoms   Symptoms improved  but did not resolve and have been migrating from ears to frontal sinuses to maxillary .  Did not take prednisone  due to prior episode in August causing agitation     After starting levaquin symptoms improved transiently ,  ,but became worse today after spending a few hours outside yesterday raking leaves.   Outpatient Medications Prior to Visit  Medication Sig Dispense Refill  . ARMOUR THYROID 30 MG tablet TAKE 1 TABLET BY MOUTH DAILY BEFORE BREAKFAST 90 tablet 0  . levofloxacin (LEVAQUIN) 500 MG tablet Take 1 tablet (500 mg total) by mouth daily. 7 tablet 0  . mometasone (NASONEX) 50 MCG/ACT nasal spray Place 2 sprays into the nose daily. 17 g 12  . Multiple Vitamins-Minerals (MULTIVITAMIN ADULTS 50+ PO) Take 1 tablet by mouth daily.    Marland Kitchen albuterol (PROVENTIL HFA;VENTOLIN HFA) 108 (90 Base) MCG/ACT inhaler Inhale 2 puffs into the lungs every 6 (six) hours as needed for wheezing or shortness of breath. (Patient not taking: Reported on 05/06/2018) 1 Inhaler 11  . cholestyramine light (PREVALITE) 4 g packet Take 1 packet  (4 g total) by mouth daily. 30 packet 0  . diphenoxylate-atropine (LOMOTIL) 2.5-0.025 MG tablet Take 1 tablet 4 (four) times daily as needed by mouth for diarrhea or loose stools. (Patient not taking: Reported on 05/06/2018) 30 tablet 0  . naproxen (NAPROSYN) 500 MG tablet Take 1 tablet (500 mg total) by mouth 2 (two) times daily with a meal. (Patient not taking: Reported on 05/06/2018) 60 tablet 2  . pseudoephedrine-codeine-guaifenesin (MYTUSSIN DAC) 30-10-100 MG/5ML solution Take 10 mLs by mouth 4 (four) times daily as needed for cough. (Patient not taking: Reported on 05/06/2018) 120 mL 0  . tiZANidine (ZANAFLEX) 2 MG tablet Take 1 tablet (2 mg total) by mouth every 6 (six) hours as needed for muscle spasms. (Patient not taking: Reported on 05/06/2018) 60 tablet 1   No facility-administered medications prior to visit.     Review of Systems;  Patient denies headache, fevers, malaise, unintentional weight loss, skin rash, eye pain, sinus congestion and sinus pain, sore throat, dysphagia,  hemoptysis , cough, dyspnea, wheezing, chest pain, palpitations, orthopnea, edema, abdominal pain, nausea, melena, diarrhea, constipation, flank pain, dysuria, hematuria, urinary  Frequency, nocturia, numbness, tingling, seizures,  Focal weakness, Loss of consciousness,  Tremor, insomnia, depression, anxiety, and suicidal ideation.      Objective:  BP 122/76 (BP Location: Left Arm, Patient Position: Sitting, Cuff Size: Normal)   Pulse 94   Temp 98.3 F (36.8 C)   Wt 163 lb (73.9 kg)  SpO2 98%   BMI 24.78 kg/m   BP Readings from Last 3 Encounters:  05/06/18 122/76  02/26/18 123/75  11/28/17 (!) 142/84    Wt Readings from Last 3 Encounters:  05/06/18 163 lb (73.9 kg)  02/26/18 150 lb (68 kg)  11/28/17 150 lb (68 kg)    General appearance: alert, cooperative and appears stated age Ears: normal TM's and external ear canals both ears Throat: lips, mucosa, and tongue normal; teeth and gums  normal Neck: no adenopathy, no carotid bruit, supple, symmetrical, trachea midline and thyroid not enlarged, symmetric, no tenderness/mass/nodules Back: symmetric, no curvature. ROM normal. No CVA tenderness. Lungs: clear to auscultation bilaterally Heart: regular rate and rhythm, S1, S2 normal, no murmur, click, rub or gallop Abdomen: soft, non-tender; bowel sounds normal; no masses,  no organomegaly Pulses: 2+ and symmetric Skin: Skin color, texture, turgor normal. No rashes or lesions Lymph nodes: Cervical, supraclavicular, and axillary nodes normal.  No results found for: HGBA1C  Lab Results  Component Value Date   CREATININE 0.76 10/10/2017   CREATININE 0.76 06/20/2016   CREATININE 0.73 12/27/2014    Lab Results  Component Value Date   WBC 5.9 10/23/2017   HGB 14.1 10/23/2017   HCT 42.2 10/23/2017   PLT 145.0 (L) 10/23/2017   GLUCOSE 97 10/10/2017   CHOL 150 10/10/2017   TRIG 178.0 (H) 10/10/2017   HDL 51.70 10/10/2017   LDLDIRECT 77.0 06/20/2016   LDLCALC 62 10/10/2017   ALT 16 10/10/2017   AST 16 10/10/2017   NA 141 10/10/2017   K 3.7 10/10/2017   CL 105 10/10/2017   CREATININE 0.76 10/10/2017   BUN 10 10/10/2017   CO2 28 10/10/2017   TSH 1.31 10/10/2017    No results found.  Assessment & Plan:   Problem List Items Addressed This Visit    Acute non-recurrent frontal sinusitis    Aggravated by seasonal allergies.  Continue levaquin ,  Sudafed/afrin Add prednsione,  Benadryl  And daytime anthistamine      Relevant Medications   predniSONE (DELTASONE) 20 MG tablet   methylPREDNISolone acetate (DEPO-MEDROL) injection 40 mg (Completed)    Other Visit Diagnoses    Acute recurrent sinusitis, unspecified location    -  Primary   Relevant Medications   predniSONE (DELTASONE) 20 MG tablet   methylPREDNISolone acetate (DEPO-MEDROL) injection 40 mg (Completed)      I have discontinued Ameliana K. Ewell's tiZANidine. I am also having her start on predniSONE.  Additionally, I am having her maintain her Multiple Vitamins-Minerals (MULTIVITAMIN ADULTS 50+ PO), albuterol, diphenoxylate-atropine, naproxen, cholestyramine light, pseudoephedrine-codeine-guaifenesin, ARMOUR THYROID, levofloxacin, and mometasone. We administered methylPREDNISolone acetate.  Meds ordered this encounter  Medications  . predniSONE (DELTASONE) 20 MG tablet    Sig: Take 1 tablet (20 mg total) by mouth daily with breakfast. 6 tablets on Day 1 , then reduce by 1 tablet daily until gone    Dispense:  5 tablet    Refill:  0  . methylPREDNISolone acetate (DEPO-MEDROL) injection 40 mg    Medications Discontinued During This Encounter  Medication Reason  . tiZANidine (ZANAFLEX) 2 MG tablet Completed Course    Follow-up: No follow-ups on file.   Crecencio Mc, MD

## 2018-05-07 ENCOUNTER — Encounter: Payer: Self-pay | Admitting: Internal Medicine

## 2018-05-07 DIAGNOSIS — J0110 Acute frontal sinusitis, unspecified: Secondary | ICD-10-CM | POA: Insufficient documentation

## 2018-05-07 NOTE — Assessment & Plan Note (Addendum)
Aggravated by seasonal allergies.  Continue levaquin ,  Sudafed/afrin Add prednsione,  Benadryl  And daytime anthistamine

## 2018-07-19 ENCOUNTER — Other Ambulatory Visit: Payer: Self-pay | Admitting: Internal Medicine

## 2018-08-26 ENCOUNTER — Other Ambulatory Visit: Payer: Self-pay | Admitting: Internal Medicine

## 2018-08-26 MED ORDER — DIPHENOXYLATE-ATROPINE 2.5-0.025 MG PO TABS: 1.0000 | ORAL_TABLET | Freq: Four times a day (QID) | ORAL | 5 refills | Status: DC | PRN

## 2018-10-24 ENCOUNTER — Encounter: Payer: BLUE CROSS/BLUE SHIELD | Admitting: Internal Medicine

## 2018-11-18 DIAGNOSIS — H43819 Vitreous degeneration, unspecified eye: Secondary | ICD-10-CM | POA: Diagnosis not present

## 2018-11-18 DIAGNOSIS — H5213 Myopia, bilateral: Secondary | ICD-10-CM | POA: Diagnosis not present

## 2018-11-18 DIAGNOSIS — H2513 Age-related nuclear cataract, bilateral: Secondary | ICD-10-CM | POA: Diagnosis not present

## 2018-11-18 DIAGNOSIS — D3132 Benign neoplasm of left choroid: Secondary | ICD-10-CM | POA: Diagnosis not present

## 2018-11-28 ENCOUNTER — Encounter: Payer: Self-pay | Admitting: Internal Medicine

## 2019-04-05 ENCOUNTER — Other Ambulatory Visit: Payer: Self-pay | Admitting: Internal Medicine

## 2019-04-05 DIAGNOSIS — L247 Irritant contact dermatitis due to plants, except food: Secondary | ICD-10-CM | POA: Insufficient documentation

## 2019-04-05 MED ORDER — TRIAMCINOLONE ACETONIDE 0.1 % EX CREA: 1.0000 "application " | TOPICAL_CREAM | Freq: Two times a day (BID) | CUTANEOUS | 0 refills | Status: DC

## 2019-04-05 MED ORDER — PREDNISONE 10 MG PO TABS: ORAL_TABLET | ORAL | 0 refills | Status: DC

## 2019-04-06 ENCOUNTER — Other Ambulatory Visit: Payer: Self-pay | Admitting: Internal Medicine

## 2019-04-06 MED ORDER — PREDNISONE 10 MG PO TABS: ORAL_TABLET | ORAL | 0 refills | Status: DC

## 2019-04-09 DIAGNOSIS — Z03818 Encounter for observation for suspected exposure to other biological agents ruled out: Secondary | ICD-10-CM | POA: Diagnosis not present

## 2019-06-02 ENCOUNTER — Telehealth: Payer: Self-pay | Admitting: Internal Medicine

## 2019-06-02 ENCOUNTER — Other Ambulatory Visit: Payer: Self-pay | Admitting: Internal Medicine

## 2019-06-02 DIAGNOSIS — E538 Deficiency of other specified B group vitamins: Secondary | ICD-10-CM

## 2019-06-02 DIAGNOSIS — D696 Thrombocytopenia, unspecified: Secondary | ICD-10-CM

## 2019-06-02 DIAGNOSIS — Z789 Other specified health status: Secondary | ICD-10-CM

## 2019-06-02 DIAGNOSIS — E781 Pure hyperglyceridemia: Secondary | ICD-10-CM

## 2019-06-02 DIAGNOSIS — E039 Hypothyroidism, unspecified: Secondary | ICD-10-CM

## 2019-06-02 MED ORDER — THYROID 30 MG PO TABS: ORAL_TABLET | ORAL | 1 refills | Status: DC

## 2019-06-02 MED ORDER — DIPHENOXYLATE-ATROPINE 2.5-0.025 MG PO TABS: 1.0000 | ORAL_TABLET | Freq: Four times a day (QID) | ORAL | 5 refills | Status: DC | PRN

## 2019-06-02 NOTE — Telephone Encounter (Signed)
meds refilled and labs ordered  Needs RN visit foR Td vaccine (NOT tDAp)   AND FASTING LABS APPT SAME DAY

## 2019-06-17 ENCOUNTER — Other Ambulatory Visit (INDEPENDENT_AMBULATORY_CARE_PROVIDER_SITE_OTHER): Payer: BC Managed Care – PPO

## 2019-06-17 ENCOUNTER — Other Ambulatory Visit: Payer: Self-pay

## 2019-06-17 ENCOUNTER — Ambulatory Visit (INDEPENDENT_AMBULATORY_CARE_PROVIDER_SITE_OTHER): Payer: BC Managed Care – PPO

## 2019-06-17 DIAGNOSIS — E039 Hypothyroidism, unspecified: Secondary | ICD-10-CM

## 2019-06-17 DIAGNOSIS — E781 Pure hyperglyceridemia: Secondary | ICD-10-CM | POA: Diagnosis not present

## 2019-06-17 DIAGNOSIS — D696 Thrombocytopenia, unspecified: Secondary | ICD-10-CM

## 2019-06-17 DIAGNOSIS — Z23 Encounter for immunization: Secondary | ICD-10-CM | POA: Diagnosis not present

## 2019-06-17 DIAGNOSIS — E538 Deficiency of other specified B group vitamins: Secondary | ICD-10-CM

## 2019-06-17 LAB — LIPID PANEL
Cholesterol: 182 mg/dL (ref 0–200)
HDL: 61.6 mg/dL (ref >39.00)
LDL Cholesterol: 81 mg/dL (ref 0–99)
NonHDL: 120.45
Total CHOL/HDL Ratio: 3
Triglycerides: 197 mg/dL — ABNORMAL HIGH (ref 0.0–149.0)
VLDL: 39.4 mg/dL (ref 0.0–40.0)

## 2019-06-17 LAB — CBC WITH DIFFERENTIAL/PLATELET
Basophils Absolute: 0 10*3/uL (ref 0.0–0.1)
Basophils Relative: 0.5 % (ref 0.0–3.0)
Eosinophils Absolute: 0 10*3/uL (ref 0.0–0.7)
Eosinophils Relative: 1.2 % (ref 0.0–5.0)
HCT: 41.4 % (ref 36.0–46.0)
Hemoglobin: 13.5 g/dL (ref 12.0–15.0)
Lymphocytes Relative: 33.1 % (ref 12.0–46.0)
Lymphs Abs: 1.3 10*3/uL (ref 0.7–4.0)
MCHC: 32.6 g/dL (ref 30.0–36.0)
MCV: 87 fl (ref 78.0–100.0)
Monocytes Absolute: 0.2 10*3/uL (ref 0.1–1.0)
Monocytes Relative: 5.9 % (ref 3.0–12.0)
Neutro Abs: 2.4 10*3/uL (ref 1.4–7.7)
Neutrophils Relative %: 59.3 % (ref 43.0–77.0)
Platelets: 163 10*3/uL (ref 150.0–400.0)
RBC: 4.75 Mil/uL (ref 3.87–5.11)
RDW: 13.9 % (ref 11.5–15.5)
WBC: 4 10*3/uL (ref 4.0–10.5)

## 2019-06-17 LAB — COMPREHENSIVE METABOLIC PANEL
ALT: 19 U/L (ref 0–35)
AST: 21 U/L (ref 0–37)
Albumin: 4.3 g/dL (ref 3.5–5.2)
Alkaline Phosphatase: 52 U/L (ref 39–117)
BUN: 10 mg/dL (ref 6–23)
CO2: 30 mEq/L (ref 19–32)
Calcium: 9.2 mg/dL (ref 8.4–10.5)
Chloride: 102 mEq/L (ref 96–112)
Creatinine, Ser: 0.7 mg/dL (ref 0.40–1.20)
GFR: 84.4 mL/min (ref >60.00)
Glucose, Bld: 84 mg/dL (ref 70–99)
Potassium: 4.2 mEq/L (ref 3.5–5.1)
Sodium: 138 mEq/L (ref 135–145)
Total Bilirubin: 0.8 mg/dL (ref 0.2–1.2)
Total Protein: 6.4 g/dL (ref 6.0–8.3)

## 2019-06-17 LAB — VITAMIN B12: Vitamin B-12: 295 pg/mL (ref 211–911)

## 2019-06-18 LAB — THYROID PANEL WITH TSH
Free Thyroxine Index: 2.3 (ref 1.4–3.8)
T3 Uptake: 30 % (ref 22–35)
T4, Total: 7.8 ug/dL (ref 5.1–11.9)
TSH: 2.53 mIU/L (ref 0.40–4.50)

## 2019-06-22 ENCOUNTER — Other Ambulatory Visit: Payer: Self-pay | Admitting: Internal Medicine

## 2019-06-22 MED ORDER — CYANOCOBALAMIN 1000 MCG/ML IJ SOLN: 1000.0000 ug | INTRAMUSCULAR | 0 refills | Status: DC

## 2019-07-07 ENCOUNTER — Other Ambulatory Visit: Payer: Self-pay | Admitting: Internal Medicine

## 2019-07-07 ENCOUNTER — Encounter: Payer: Self-pay | Admitting: Internal Medicine

## 2019-07-07 MED ORDER — "SYRINGE 25G X 1"" 3 ML MISC": 0 refills | Status: DC

## 2019-09-29 ENCOUNTER — Other Ambulatory Visit: Payer: Self-pay

## 2019-09-29 MED ORDER — "SYRINGE 25G X 1"" 3 ML MISC": 0 refills | Status: DC

## 2019-11-20 MED ORDER — THYROID 30 MG PO TABS: ORAL_TABLET | ORAL | 1 refills | Status: DC

## 2020-01-04 DIAGNOSIS — H2513 Age-related nuclear cataract, bilateral: Secondary | ICD-10-CM | POA: Diagnosis not present

## 2020-01-04 DIAGNOSIS — H43813 Vitreous degeneration, bilateral: Secondary | ICD-10-CM | POA: Diagnosis not present

## 2020-01-04 DIAGNOSIS — D3132 Benign neoplasm of left choroid: Secondary | ICD-10-CM | POA: Diagnosis not present

## 2020-01-04 DIAGNOSIS — H04123 Dry eye syndrome of bilateral lacrimal glands: Secondary | ICD-10-CM | POA: Diagnosis not present

## 2020-01-12 ENCOUNTER — Ambulatory Visit (INDEPENDENT_AMBULATORY_CARE_PROVIDER_SITE_OTHER): Payer: BC Managed Care – PPO | Admitting: Dermatology

## 2020-01-12 ENCOUNTER — Other Ambulatory Visit: Payer: Self-pay

## 2020-01-12 DIAGNOSIS — L821 Other seborrheic keratosis: Secondary | ICD-10-CM | POA: Diagnosis not present

## 2020-01-12 DIAGNOSIS — D485 Neoplasm of uncertain behavior of skin: Secondary | ICD-10-CM | POA: Diagnosis not present

## 2020-01-12 DIAGNOSIS — L570 Actinic keratosis: Secondary | ICD-10-CM

## 2020-01-12 DIAGNOSIS — D492 Neoplasm of unspecified behavior of bone, soft tissue, and skin: Secondary | ICD-10-CM

## 2020-01-12 NOTE — Progress Notes (Signed)
   Follow-Up Visit   Subjective  Carolyn Bass is a 64 y.o. female who presents for the following: Skin Problem (pt c/o spots on her face and legs, changing and growing for several months ).  The following portions of the chart were reviewed this encounter and updated as appropriate:  Tobacco  Allergies  Meds  Problems  Med Hx  Surg Hx  Fam Hx      Review of Systems:  No other skin or systemic complaints except as noted in HPI or Assessment and Plan.  Objective  Well appearing patient in no apparent distress; mood and affect are within normal limits.  A focused examination was performed including face, lips, nose, eyelids, ears, bilateral legs. Relevant physical exam findings are noted in the Assessment and Plan.  Objective  Left Thigh medial: 0.2 cm erythematous tan papule   Objective  L cheek x 1: Erythematous thin papules/macules with gritty scale.    Assessment & Plan  Neoplasm of skin Left Thigh medial  Favor ISK vs  Dermatofibroma   Benign-appearing.  Observation.  Call clinic for new or changing lesions.  Recommend daily use of broad spectrum spf 30+ sunscreen to sun-exposed areas.    AK (actinic keratosis) L cheek x 1  Recheck in 3 months Aks   Destruction of lesion - L cheek x 1 Complexity: simple   Destruction method: cryotherapy   Informed consent: discussed and consent obtained   Timeout:  patient name, date of birth, surgical site, and procedure verified Lesion destroyed using liquid nitrogen: Yes   Region frozen until ice ball extended beyond lesion: Yes   Outcome: patient tolerated procedure well with no complications   Post-procedure details: wound care instructions given    Seborrheic Keratoses - Stuck-on, waxy, tan-brown papules and plaques at legs and chin - Discussed benign etiology and prognosis. - Observe - Call for any changes   Return in about 3 months (around 04/13/2020) for TBSE, AKs.  I, Marye Round, CMA, am acting as  scribe for Forest Gleason, MD .  Documentation: I have reviewed the above documentation for accuracy and completeness, and I agree with the above.  Forest Gleason, MD

## 2020-01-12 NOTE — Patient Instructions (Addendum)
Cryotherapy Aftercare  . Wash gently with soap and water everyday.   . Apply Vaseline and Band-Aid daily until healed. Recommend daily broad spectrum sunscreen SPF 30+ to sun-exposed areas, reapply every 2 hours as needed. Call for new or changing lesions.  

## 2020-01-18 ENCOUNTER — Encounter: Payer: Self-pay | Admitting: Dermatology

## 2020-02-28 DIAGNOSIS — W268XXA Contact with other sharp object(s), not elsewhere classified, initial encounter: Secondary | ICD-10-CM | POA: Diagnosis not present

## 2020-02-28 DIAGNOSIS — Y9301 Activity, walking, marching and hiking: Secondary | ICD-10-CM | POA: Diagnosis not present

## 2020-02-28 DIAGNOSIS — S81811A Laceration without foreign body, right lower leg, initial encounter: Secondary | ICD-10-CM | POA: Diagnosis not present

## 2020-05-05 ENCOUNTER — Other Ambulatory Visit: Payer: Self-pay

## 2020-05-05 ENCOUNTER — Ambulatory Visit (INDEPENDENT_AMBULATORY_CARE_PROVIDER_SITE_OTHER): Payer: BC Managed Care – PPO | Admitting: Dermatology

## 2020-05-05 DIAGNOSIS — L578 Other skin changes due to chronic exposure to nonionizing radiation: Secondary | ICD-10-CM

## 2020-05-05 DIAGNOSIS — Z1283 Encounter for screening for malignant neoplasm of skin: Secondary | ICD-10-CM

## 2020-05-05 DIAGNOSIS — L821 Other seborrheic keratosis: Secondary | ICD-10-CM

## 2020-05-05 DIAGNOSIS — D229 Melanocytic nevi, unspecified: Secondary | ICD-10-CM

## 2020-05-05 DIAGNOSIS — L814 Other melanin hyperpigmentation: Secondary | ICD-10-CM

## 2020-05-05 DIAGNOSIS — L570 Actinic keratosis: Secondary | ICD-10-CM | POA: Diagnosis not present

## 2020-05-05 DIAGNOSIS — D1800 Hemangioma unspecified site: Secondary | ICD-10-CM

## 2020-05-05 NOTE — Progress Notes (Signed)
   Follow-Up Visit   Subjective  Carolyn Bass is a 64 y.o. female who presents for the following: FBSE (Patient here today for 3 month TBSE and recheck of Ak on left cheek treated using Ln2 on 8/24. She denies any new areas of concern today and denies any concerns with area on left cheek. ).  Patient has no history of skin cancer.  Patient here for full body skin exam and skin cancer screening.  The following portions of the chart were reviewed this encounter and updated as appropriate:  Tobacco  Allergies  Meds  Problems  Med Hx  Surg Hx  Fam Hx       Objective  Well appearing patient in no apparent distress; mood and affect are within normal limits.  A full examination was performed including scalp, head, eyes, ears, nose, lips, neck, chest, axillae, abdomen, back, buttocks, bilateral upper extremities, bilateral lower extremities, hands, feet, fingers, toes, fingernails, and toenails. All findings within normal limits unless otherwise noted below.  Objective  left cheek, nose,left forehead above brow: Erythematous thin papules/macules with gritty scale.   Assessment & Plan  Actinic keratosis left cheek, nose,left forehead above brow  Chronic condition with expected duration over one year. Currently active and with risk of progression to skin cancer if not treated.  Discussed option of cryotherapy vs topical therapy with prescription 5-fluorouracil/calcipotriene cream. Pt prefers 5FU/calcipotriene  - Start 5-fluorouracil/calcipotriene cream twice a day for 4 days to affected areas including left cheek, nose, left forehead above brow.  Prescription sent to [facility]. Patient advised they will receive an email to purchase the medication online and have it sent to their home. Patient provided with handout reviewing treatment course and side effects and advised to call or message [place] on MyChart with any concerns.  Reviewed course of treatment and  expected reaction.  Patient advised to expect inflammation and crusting and advised that erosions are possible.  Patient advised to be diligent with sun protection during and after treatment. Handout with details of how to apply medication and what to expect provided.   Actinic Damage - Chronic, secondary to cumulative UV radiation exposure over time - diffuse scaly erythematous macules and papules with underlying dyspigmentation - Recommend daily broad spectrum sunscreen SPF 30+ to sun-exposed areas, reapply every 2 hours as needed.  - Call for new or changing lesions.  Lentigines - Scattered tan macules - Discussed due to sun exposure - Benign, observe - Call for any changes  Seborrheic Keratoses - Stuck-on, waxy, tan-brown papules and plaques  - Discussed benign etiology and prognosis. - Observe - Call for any changes  Melanocytic Nevi - Tan-brown and/or pink-flesh-colored symmetric macules and papules - Benign appearing on exam today - Observation - Call clinic for new or changing moles - Recommend daily use of broad spectrum spf 30+ sunscreen to sun-exposed areas.   Hemangiomas - Red papules - Discussed benign nature - Observe - Call for any changes  Skin cancer screening performed today.    Return in about 19 weeks (around 09/15/2020) for ak followup.  I, Ruthell Rummage, CMA, am acting as scribe for Forest Gleason, MD.  Documentation: I have reviewed the above documentation for accuracy and completeness, and I agree with the above.  Forest Gleason, MD

## 2020-05-05 NOTE — Patient Instructions (Addendum)
Recommend taking Heliocare sun protection supplement daily in sunny weather for additional sun protection. For maximum protection on the sunniest days, you can take up to 2 capsules of regular Heliocare OR take 1 capsule of Heliocare Ultra. For prolonged exposure (such as a full day in the sun), you can repeat your dose of the supplement 4 hours after your first dose. Heliocare can be purchased at [facility] or at [email or web address].    Melanoma ABCDEs  Melanoma is the most dangerous type of skin cancer, and is the leading cause of death from skin disease.  You are more likely to develop melanoma if you:  Have light-colored skin, light-colored eyes, or red or blond hair  Spend a lot of time in the sun  Tan regularly, either outdoors or in a tanning bed  Have had blistering sunburns, especially during childhood  Have a close family member who has had a melanoma  Have atypical moles or large birthmarks  Early detection of melanoma is key since treatment is typically straightforward and cure rates are extremely high if we catch it early.   The first sign of melanoma is often a change in a mole or a new dark spot.  The ABCDE system is a way of remembering the signs of melanoma.  A for asymmetry:  The two halves do not match. B for border:  The edges of the growth are irregular. C for color:  A mixture of colors are present instead of an even brown color. D for diameter:  Melanomas are usually (but not always) greater than 3mm - the size of a pencil eraser. E for evolution:  The spot keeps changing in size, shape, and color.  Please check your skin once per month between visits. You can use a small mirror in front and a large mirror behind you to keep an eye on the back side or your body.   If you see any new or changing lesions before your next follow-up, please call to schedule a visit.  Please continue daily skin protection including broad spectrum sunscreen SPF 30+ to  sun-exposed areas, reapplying every 2 hours as needed when you're outdoors.    5-Fluorouracil/Calcipotriene Patient Education   Actinic keratoses are the dry, red scaly spots on the skin caused by sun damage. A portion of these spots can turn into skin cancer with time, and treating them can help prevent development of skin cancer.   Treatment of these spots requires removal of the defective skin cells. There are various ways to remove actinic keratoses, including freezing with liquid nitrogen, treatment with creams, or treatment with a blue light procedure in the office.   5-fluorouracil cream is a topical cream used to treat actinic keratoses. It works by interfering with the growth of abnormal fast-growing skin cells, such as actinic keratoses. These cells peel off and are replaced by healthy ones.   5-fluorouracil/calcipotriene is a combination of the 5-fluorouracil cream with a vitamin D analog cream called calcipotriene. The calcipotriene alone does not treat actinic keratoses. However, when it is combined with 5-fluorouracil, it helps the 5-fluorouracil treat the actinic keratoses much faster so that the same results can be achieved with a much shorter treatment time.  INSTRUCTIONS FOR 5-FLUOROURACIL/CALCIPOTRIENE CREAM:   5-fluorouracil/calcipotriene cream typically only needs to be used for 4-5 days. A thin layer should be applied twice a day to the treatment areas ( nose, left cheek, left forehead above brow) recommended by your physician.   If your physician prescribed  you separate tubes of 5-fluourouracil and calcipotriene, apply a thin layer of 5-fluorouracil followed by a thin layer of calcipotriene.   Avoid contact with your eyes, nostrils, and mouth. Do not use 5-fluorouracil/calcipotriene cream on infected or open wounds.   You will develop redness, irritation and some crusting at areas where you have pre-cancer damage/actinic keratoses. IF YOU DEVELOP PAIN, BLEEDING, OR  SIGNIFICANT CRUSTING, STOP THE TREATMENT EARLY - you have already gotten a good response and the actinic keratoses should clear up well.  Wash your hands after applying 5-fluorouracil 5% cream on your skin.   A moisturizer or sunscreen with a minimum SPF 30 should be applied each morning.   Once you have finished the treatment, you can apply a thin layer of Vaseline twice a day to irritated areas to soothe and calm the areas more quickly. If you experience significant discomfort, contact your physician.  For some patients it is necessary to repeat the treatment for best results.  SIDE EFFECTS: When using 5-fluorouracil/calcipotriene cream, you may have mild irritation, such as redness, dryness, swelling, or a mild burning sensation. This usually resolves within 2 weeks. The more actinic keratoses you have, the more redness and inflammation you can expect during treatment. Eye irritation has been reported rarely. If this occurs, please let us know.  If you have any trouble using this cream, please call the office. If you have any other questions about this information, please do not hesitate to ask me before you leave the office.   Instructions for [facility]  One or more of your medications was sent to the [facility]. You will receive an email from them and can purchase the medicine through that link. It will then be mailed to your home at the address you confirmed. If for any reason you do not receive an email from them, please check your spam folder. If you still do not find the email, please let us know. [facility] phone number is [phone].  Recommends  Serica moisturizing scar formula

## 2020-05-09 MED ORDER — THYROID 30 MG PO TABS: ORAL_TABLET | ORAL | 0 refills | Status: DC

## 2020-05-11 ENCOUNTER — Encounter: Payer: Self-pay | Admitting: Dermatology

## 2020-06-26 DIAGNOSIS — E559 Vitamin D deficiency, unspecified: Secondary | ICD-10-CM

## 2020-06-26 DIAGNOSIS — E538 Deficiency of other specified B group vitamins: Secondary | ICD-10-CM

## 2020-06-26 DIAGNOSIS — D696 Thrombocytopenia, unspecified: Secondary | ICD-10-CM

## 2020-06-26 DIAGNOSIS — Z9229 Personal history of other drug therapy: Secondary | ICD-10-CM

## 2020-06-26 DIAGNOSIS — K909 Intestinal malabsorption, unspecified: Secondary | ICD-10-CM

## 2020-06-26 DIAGNOSIS — E781 Pure hyperglyceridemia: Secondary | ICD-10-CM

## 2020-07-01 ENCOUNTER — Other Ambulatory Visit (INDEPENDENT_AMBULATORY_CARE_PROVIDER_SITE_OTHER): Payer: BC Managed Care – PPO

## 2020-07-01 ENCOUNTER — Other Ambulatory Visit: Payer: Self-pay

## 2020-07-01 DIAGNOSIS — D696 Thrombocytopenia, unspecified: Secondary | ICD-10-CM | POA: Diagnosis not present

## 2020-07-01 DIAGNOSIS — E559 Vitamin D deficiency, unspecified: Secondary | ICD-10-CM | POA: Diagnosis not present

## 2020-07-01 DIAGNOSIS — E538 Deficiency of other specified B group vitamins: Secondary | ICD-10-CM

## 2020-07-01 DIAGNOSIS — K909 Intestinal malabsorption, unspecified: Secondary | ICD-10-CM

## 2020-07-01 DIAGNOSIS — Z9229 Personal history of other drug therapy: Secondary | ICD-10-CM | POA: Diagnosis not present

## 2020-07-01 DIAGNOSIS — E781 Pure hyperglyceridemia: Secondary | ICD-10-CM

## 2020-07-01 LAB — COMPREHENSIVE METABOLIC PANEL
ALT: 21 U/L (ref 0–35)
AST: 20 U/L (ref 0–37)
Albumin: 4.3 g/dL (ref 3.5–5.2)
Alkaline Phosphatase: 57 U/L (ref 39–117)
BUN: 11 mg/dL (ref 6–23)
CO2: 30 mEq/L (ref 19–32)
Calcium: 9.2 mg/dL (ref 8.4–10.5)
Chloride: 102 mEq/L (ref 96–112)
Creatinine, Ser: 0.75 mg/dL (ref 0.40–1.20)
GFR: 84.12 mL/min (ref >60.00)
Glucose, Bld: 83 mg/dL (ref 70–99)
Potassium: 3.8 mEq/L (ref 3.5–5.1)
Sodium: 140 mEq/L (ref 135–145)
Total Bilirubin: 0.9 mg/dL (ref 0.2–1.2)
Total Protein: 6.8 g/dL (ref 6.0–8.3)

## 2020-07-01 LAB — TSH: TSH: 2.03 u[IU]/mL (ref 0.35–4.50)

## 2020-07-01 LAB — LIPID PANEL
Cholesterol: 179 mg/dL (ref 0–200)
HDL: 63.8 mg/dL (ref >39.00)
LDL Cholesterol: 88 mg/dL (ref 0–99)
NonHDL: 115.26
Total CHOL/HDL Ratio: 3
Triglycerides: 138 mg/dL (ref 0.0–149.0)
VLDL: 27.6 mg/dL (ref 0.0–40.0)

## 2020-07-01 LAB — VITAMIN D 25 HYDROXY (VIT D DEFICIENCY, FRACTURES): VITD: 37.79 ng/mL (ref 30.00–100.00)

## 2020-07-01 LAB — VITAMIN B12: Vitamin B-12: >1506 pg/mL — ABNORMAL HIGH (ref 211–911)

## 2020-07-03 ENCOUNTER — Other Ambulatory Visit: Payer: Self-pay | Admitting: Internal Medicine

## 2020-07-05 LAB — SARS-COV-2 SEMI-QUANTITATIVE TOTAL ANTIBODY, SPIKE: SARS COV2 AB, Total Spike Semi QN: >2500 U/mL — ABNORMAL HIGH (ref <0.8)

## 2020-07-06 NOTE — Progress Notes (Signed)
Carolyn Bass,  You have a very robust COVID antibody titre, so if you encounter COVID in [place] you should have a mild case .Marland Kitchen..and  Safe travels !!! Sorry we were unable to get together before you left,  but we will definitely see you when you get back   Regards,   Helene Kelp

## 2020-08-05 ENCOUNTER — Other Ambulatory Visit: Payer: Self-pay | Admitting: Internal Medicine

## 2020-09-08 ENCOUNTER — Ambulatory Visit (INDEPENDENT_AMBULATORY_CARE_PROVIDER_SITE_OTHER): Payer: BC Managed Care – PPO | Admitting: Dermatology

## 2020-09-08 ENCOUNTER — Other Ambulatory Visit: Payer: Self-pay

## 2020-09-08 DIAGNOSIS — L821 Other seborrheic keratosis: Secondary | ICD-10-CM | POA: Diagnosis not present

## 2020-09-08 DIAGNOSIS — L578 Other skin changes due to chronic exposure to nonionizing radiation: Secondary | ICD-10-CM | POA: Diagnosis not present

## 2020-09-08 DIAGNOSIS — D485 Neoplasm of uncertain behavior of skin: Secondary | ICD-10-CM | POA: Diagnosis not present

## 2020-09-08 DIAGNOSIS — D489 Neoplasm of uncertain behavior, unspecified: Secondary | ICD-10-CM

## 2020-09-08 DIAGNOSIS — L570 Actinic keratosis: Secondary | ICD-10-CM | POA: Diagnosis not present

## 2020-09-08 NOTE — Patient Instructions (Addendum)
Continue 5-fluorouracil/calcipotriene cream twice a day for 7 days to affected areas including left brow, nose, left cheek.  Prescription sent to [facility]. Patient advised they will receive an email to purchase the medication online and have it sent to their home. Patient provided with handout reviewing treatment course and side effects and advised to call or message [place] on MyChart with any concerns.  Recommend daily broad spectrum sunscreen SPF 30+ to sun-exposed areas, reapply every 2 hours as needed. Call for new or changing lesions.  Staying in the shade or wearing long sleeves, sun glasses (UVA+UVB protection) and wide brim hats (4-inch brim around the entire circumference of the hat) are also recommended for sun protection.   Recommend Vaseline and Band-Aid for area on left medial thigh, if any changes before next appointment please call office.   If you have any questions or concerns for your doctor, please call our main line at [phone] and press option 4 to reach your doctor's medical assistant. If no one answers, please leave a voicemail as directed and we will return your call as soon as possible. Messages left after 4 pm will be answered the following business day.   You may also send [place] a message via [place]. We typically respond to MyChart messages within 1-2 business days.  For prescription refills, please ask your pharmacy to contact our office. Our fax number is [phone].  If you have an urgent issue when the clinic is closed that cannot wait until the next business day, you can page your doctor at the number below.    Please note that while we do our best to be available for urgent issues outside of office hours, we are not available 24/7.   If you have an urgent issue and are unable to reach [place], you may choose to seek medical care at your doctor's office, retail clinic, urgent care center, or emergency room.  If you have a medical emergency,  please immediately call 911 or go to the emergency department.  Pager Numbers  - Dr. Nehemiah Massed: [phone]  - Dr. Laurence Ferrari: [phone]  - Dr. Nicole Kindred: [phone]  In the event of inclement weather, please call our main line at [phone] for an update on the status of any delays or closures.  Dermatology Medication Tips: Please keep the boxes that topical medications come in in order to help keep track of the instructions about where and how to use these. Pharmacies typically print the medication instructions only on the boxes and not directly on the medication tubes.   If your medication is too expensive, please contact our office at [phone] option 4 or send [place] a message through [place].   We are unable to tell what your co-pay for medications will be in advance as this is different depending on your insurance coverage. However, we may be able to find a substitute medication at lower cost or fill out paperwork to get insurance to cover a needed medication.   If a prior authorization is required to get your medication covered by your insurance company, please allow [place] 1-2 business days to complete this process.  Drug prices often vary depending on where the prescription is filled and some pharmacies may offer cheaper prices.  The website www.goodrx.com contains coupons for medications through different pharmacies. The prices here do not account for what the cost may be with help from insurance (it may be cheaper with your insurance), but the website can give you the price if you  did not use any insurance.  - You can print the associated coupon and take it with your prescription to the pharmacy.  - You may also stop by our office during regular business hours and pick up a GoodRx coupon card.  - If you need your prescription sent electronically to a different pharmacy, notify our office through [facility] or by phone at [phone] option 4.

## 2020-09-08 NOTE — Progress Notes (Signed)
   Follow-Up Visit   Subjective  Carolyn Bass is a 65 y.o. female who presents for the following: ak follow up (Patient here today for follow up on aks on several areas of face. She was prescribed fluorouracil cream to treat. She reports there is still a spot on chin she would like checked. Patient also has concerns about a spot on left thigh looked at today. She reports she just noticed a couple weeks ago. Reports spot is a little crusty and different.).  The following portions of the chart were reviewed this encounter and updated as appropriate:  Tobacco  Allergies  Meds  Problems  Med Hx  Surg Hx  Fam Hx       Objective  Well appearing patient in no apparent distress; mood and affect are within normal limits.  A focused examination was performed including face and left medial thigh. Relevant physical exam findings are noted in the Assessment and Plan.  Objective  left brow, nose, left cheek: Erythematous thin papules/macules with gritty scale.   Objective  left medial thigh: 0.5 cm erythematous tan papule with overlying crust   Assessment & Plan  Actinic keratosis left brow, nose, left cheek  Actinic keratoses are precancerous spots that appear secondary to cumulative UV radiation exposure/sun exposure over time. They are chronic with expected duration over 1 year. A portion of actinic keratoses will progress to squamous cell carcinoma of the skin. It is not possible to reliably predict which spots will progress to skin cancer and so treatment is recommended to prevent development of skin cancer.  Recommend daily broad spectrum sunscreen SPF 30+ to sun-exposed areas, reapply every 2 hours as needed.  Recommend staying in the shade or wearing long sleeves, sun glasses (UVA+UVB protection) and wide brim hats (4-inch brim around the entire circumference of the hat). Call for new or changing lesions.   Start 5-fluorouracil/calcipotriene cream twice a day for 4-7 days to  affected areas including left brow, nose, left cheek.  Stop when she develops redness and crusting. Prescription sent to [facility]. Patient advised they will receive an email to purchase the medication online and have it sent to their home. Patient provided with handout reviewing treatment course and side effects and advised to call or message [place] on MyChart with any concerns.  Neoplasm of uncertain behavior left medial thigh  Favor traumatized sk  Recommend vaseline and bandaid to help with crust  Will recheck again at next follow up  Call for any changes prior to follow-up    Seborrheic Keratoses - Stuck-on, waxy, tan-brown papules and/or plaques on chin - Benign-appearing - Discussed benign etiology and prognosis. - Observe - Call for any changes  Actinic Damage - chronic, secondary to cumulative UV radiation exposure/sun exposure over time - diffuse scaly erythematous macules with underlying dyspigmentation - Recommend daily broad spectrum sunscreen SPF 30+ to sun-exposed areas, reapply every 2 hours as needed.  - Recommend staying in the shade or wearing long sleeves, sun glasses (UVA+UVB protection) and wide brim hats (4-inch brim around the entire circumference of the hat). - Call for new or changing lesions.   Return in about 6 weeks (around 10/20/2020) for ak followup.  I, Ruthell Rummage, CMA, am acting as scribe for Forest Gleason, MD.  Documentation: I have reviewed the above documentation for accuracy and completeness, and I agree with the above.  Forest Gleason, MD

## 2020-09-12 ENCOUNTER — Encounter: Payer: Self-pay | Admitting: Dermatology

## 2020-10-11 ENCOUNTER — Other Ambulatory Visit: Payer: Self-pay | Admitting: Internal Medicine

## 2020-10-11 MED ORDER — DIPHENOXYLATE-ATROPINE 2.5-0.025 MG PO TABS
1.0000 | ORAL_TABLET | Freq: Four times a day (QID) | ORAL | 5 refills | Status: DC | PRN
Start: 2020-10-11 — End: 2023-07-09

## 2020-10-11 NOTE — Telephone Encounter (Signed)
Pt is requesting a refill for Lomotil.  Refilled: 06/02/2019 Last OV: 05/06/2018 Next OV: not scheduled

## 2020-10-20 ENCOUNTER — Ambulatory Visit (INDEPENDENT_AMBULATORY_CARE_PROVIDER_SITE_OTHER): Payer: BC Managed Care – PPO | Admitting: Dermatology

## 2020-10-20 ENCOUNTER — Other Ambulatory Visit: Payer: Self-pay

## 2020-10-20 DIAGNOSIS — L821 Other seborrheic keratosis: Secondary | ICD-10-CM

## 2020-10-20 DIAGNOSIS — Z872 Personal history of diseases of the skin and subcutaneous tissue: Secondary | ICD-10-CM

## 2020-10-20 DIAGNOSIS — L578 Other skin changes due to chronic exposure to nonionizing radiation: Secondary | ICD-10-CM

## 2020-10-20 NOTE — Progress Notes (Signed)
   Follow-Up Visit   Subjective  Carolyn Bass is a 65 y.o. female who presents for the following: Follow-up (Patient here today for 6 week AK follow up. Patient treated areas at left brow, nose and left cheek twice daily for about 10 days. Patient advises the area at nose and left cheek got crusty, area at left brow was itchy. ).   The following portions of the chart were reviewed this encounter and updated as appropriate:   Tobacco  Allergies  Meds  Problems  Med Hx  Surg Hx  Fam Hx       Review of Systems:  No other skin or systemic complaints except as noted in HPI or Assessment and Plan.  Objective  Well appearing patient in no apparent distress; mood and affect are within normal limits.  A focused examination was performed including face, left leg. Relevant physical exam findings are noted in the Assessment and Plan.   Assessment & Plan    Actinic Damage - chronic, secondary to cumulative UV radiation exposure/sun exposure over time - diffuse scaly erythematous macules with underlying dyspigmentation - Recommend daily broad spectrum sunscreen SPF 30+ to sun-exposed areas, reapply every 2 hours as needed.  - Recommend staying in the shade or wearing long sleeves, sun glasses (UVA+UVB protection) and wide brim hats (4-inch brim around the entire circumference of the hat). - Call for new or changing lesions.  Seborrheic Keratoses - Stuck-on, waxy, tan-brown papules and/or plaques  - Benign-appearing - Discussed benign etiology and prognosis. - Observe - Call for any changes  History of PreCancerous Actinic Keratosis  - site(s) of PreCancerous Actinic Keratosis clear today. - these may recur and new lesions may form requiring treatment to prevent transformation into skin cancer - observe for new or changing spots and contact [place] for appointment if occur - photoprotection with sun protective clothing; sunglasses and broad spectrum sunscreen with SPF  of at least 30 + and frequent self skin exams recommended - yearly exams by a dermatologist recommended for persons with history of PreCancerous Actinic Keratoses  Return in about 1 year (around 10/20/2021) for TBSE.  Graciella Belton, RMA, am acting as scribe for Forest Gleason, MD .  Documentation: I have reviewed the above documentation for accuracy and completeness, and I agree with the above.  Forest Gleason, MD

## 2020-10-20 NOTE — Patient Instructions (Addendum)
Recommend taking Heliocare sun protection supplement daily in sunny weather for additional sun protection. For maximum protection on the sunniest days, you can take up to 2 capsules of regular Heliocare OR take 1 capsule of Heliocare Ultra. For prolonged exposure (such as a full day in the sun), you can repeat your dose of the supplement 4 hours after your first dose. Heliocare can be purchased at [facility] or at [email or web address].   Recommend daily broad spectrum sunscreen SPF 30+ to sun-exposed areas, reapply every 2 hours as needed. Call for new or changing lesions.  Staying in the shade or wearing long sleeves, sun glasses (UVA+UVB protection) and wide brim hats (4-inch brim around the entire circumference of the hat) are also recommended for sun protection.   If you have any questions or concerns for your doctor, please call our main line at [phone] and press option 4 to reach your doctor's medical assistant. If no one answers, please leave a voicemail as directed and we will return your call as soon as possible. Messages left after 4 pm will be answered the following business day.   You may also send [place] a message via [place]. We typically respond to MyChart messages within 1-2 business days.  For prescription refills, please ask your pharmacy to contact our office. Our fax number is [phone].  If you have an urgent issue when the clinic is closed that cannot wait until the next business day, you can page your doctor at the number below.    Please note that while we do our best to be available for urgent issues outside of office hours, we are not available 24/7.   If you have an urgent issue and are unable to reach [place], you may choose to seek medical care at your doctor's office, retail clinic, urgent care center, or emergency room.  If you have a medical emergency, please immediately call 911 or go to the emergency department.  Pager Numbers  - Dr. Nehemiah Massed:  [phone]  - Dr. Laurence Ferrari: [phone]  - Dr. Nicole Kindred: [phone]  In the event of inclement weather, please call our main line at [phone] for an update on the status of any delays or closures.  Dermatology Medication Tips: Please keep the boxes that topical medications come in in order to help keep track of the instructions about where and how to use these. Pharmacies typically print the medication instructions only on the boxes and not directly on the medication tubes.   If your medication is too expensive, please contact our office at [phone] option 4 or send [place] a message through [place].   We are unable to tell what your co-pay for medications will be in advance as this is different depending on your insurance coverage. However, we may be able to find a substitute medication at lower cost or fill out paperwork to get insurance to cover a needed medication.   If a prior authorization is required to get your medication covered by your insurance company, please allow [place] 1-2 business days to complete this process.  Drug prices often vary depending on where the prescription is filled and some pharmacies may offer cheaper prices.  The website www.goodrx.com contains coupons for medications through different pharmacies. The prices here do not account for what the cost may be with help from insurance (it may be cheaper with your insurance), but the website can give you the price if you did not use any insurance.  - You can print the associated coupon  and take it with your prescription to the pharmacy.  - You may also stop by our office during regular business hours and pick up a GoodRx coupon card.  - If you need your prescription sent electronically to a different pharmacy, notify our office through [facility] or by phone at [phone] option 4.

## 2020-10-26 ENCOUNTER — Encounter: Payer: Self-pay | Admitting: Internal Medicine

## 2020-10-26 ENCOUNTER — Other Ambulatory Visit: Payer: Self-pay

## 2020-10-26 ENCOUNTER — Telehealth: Payer: Self-pay | Admitting: Internal Medicine

## 2020-10-26 ENCOUNTER — Telehealth (INDEPENDENT_AMBULATORY_CARE_PROVIDER_SITE_OTHER): Payer: BC Managed Care – PPO | Admitting: Internal Medicine

## 2020-10-26 ENCOUNTER — Other Ambulatory Visit (INDEPENDENT_AMBULATORY_CARE_PROVIDER_SITE_OTHER): Payer: BC Managed Care – PPO

## 2020-10-26 DIAGNOSIS — J069 Acute upper respiratory infection, unspecified: Secondary | ICD-10-CM

## 2020-10-26 NOTE — Assessment & Plan Note (Addendum)
PND, chest tightness,  Fevers.   Rule out COVID with PCR strep and flu also in ddx.   Home and PCr test negative.  Will treat for sinusitis (bagterial) with amoxicillin, which patient already has/ .  Decongestants,  =sinus rinses,  recommende d

## 2020-10-26 NOTE — Telephone Encounter (Signed)
Having covid symptoms. High fevers and cough.  Needs testing this afternoon.  Can you call and work in at lunch either 12:45 or 1:00?

## 2020-10-26 NOTE — Progress Notes (Signed)
Telephone  Note  This visit type was conducted due to national recommendations for restrictions regarding the COVID-19 pandemic (e.g. social distancing).  This format is felt to be most appropriate for this patient at this time.  All issues noted in this document were discussed and addressed.  No physical exam was performed (except for noted visual exam findings with Video Visits).   I connected with@ on 10/26/20 at  1:00 PM EDT by  telephone and verified that I am speaking with the correct person using two identifiers. Location patient: home Location provider: work or home office Persons participating in the virtual visit: patient, provider  I discussed the limitations, risks, security and privacy concerns of performing an evaluation and management service by telephone and the availability of in person appointments. I also discussed with the patient that there may be a patient responsible charge related to this service. The patient expressed understanding and agreed to proceed.  Reason for visit: COVID SYMPTOMS   HPI:  65 yr old COVID vaccinated female presents with 3 day history of PND, with resolving cough, fevers,  Rhinitis,  chest tightness .  Symptoms started on Monday late afternoon with PND,  So self tested covid negative bc he had a dental appt on Tuesday.  covid negative on Monday,  Repeated test Tuesday afternoon,  Still negative,  Then Tuesday night developed fever to 102 , peaked at 103 along with productive cough  Started tylenol  last night ,  Today's temps are 100.  Headache and body aches relieved by Tylenol.  Cough has resolved but still having lots of PND causing hoarseness.  No loss of taste or smell.    ROS: See pertinent positives and negatives per HPI.  Past Medical History:  Diagnosis Date   Allergy    seasonal   Colon cancer ([place]) 1997   s/p resection, chemo   Colon polyp    DVT (deep venous thrombosis) ([place])    2/2 BCP   Encounter for preventive health  examination 12/30/2014   Eustachian tube dysfunction 07/03/12   Hypertriglyceridemia without hypercholesterolemia    Hypothyroidism    On Armour. Did not tolerate synthroid   Prediabetes 02/29/12   Thyroid nodule 02/29/12    Past Surgical History:  Procedure Laterality Date   COLON RESECTION Right 1997   COLONOSCOPY WITH PROPOFOL N/A 02/26/2018   Procedure: COLONOSCOPY WITH PROPOFOL;  Surgeon: Robert Bellow, MD;  Location: [facility] ENDOSCOPY;  Service: Endoscopy;  Laterality: N/A;   HERNIA REPAIR  2002   KNEE ARTHROSCOPY W/ ACL RECONSTRUCTION Left 1990   TONSILLECTOMY AND ADENOIDECTOMY  1962   TUBAL LIGATION  2001    Family History  Problem Relation Age of Onset   Hyperlipidemia Brother    Diabetes Son     SOCIAL HX:  reports that she has never smoked. She has never used smokeless tobacco. She reports current alcohol use. She reports that she does not use drugs.    Current Outpatient Medications:    diphenoxylate-atropine (LOMOTIL) 2.5-0.025 MG tablet, Take 1 tablet by mouth 4 (four) times daily as needed for diarrhea or loose stools., Disp: 60 tablet, Rfl: 5   Multiple Vitamins-Minerals (MULTIVITAMIN ADULTS 50+ PO), Take 1 tablet by mouth daily., Disp: , Rfl:    thyroid (ARMOUR THYROID) 30 MG tablet, TAKE 1 TABLET BY MOUTH DAILY BEFORE BREAKFAST, Disp: 90 tablet, Rfl: 0  EXAM:    General impression: alert, cooperative and articulate.  No signs of being in distress  Lungs: speech is  fluent sentence length suggests that patient is not short of breath and not punctuated by cough, sneezing or sniffing. Marland Kitchen   Psych: affect normal.  speech is articulate and non pressured .  Denies suicidal thoughts   ASSESSMENT AND PLAN:.  Discussed the following assessment and plan:  Viral upper respiratory tract infection - Plan: POCT rapid strep A, COVID-19, Flu A+B and RSV, CANCELED: POCT Influenza A/B, CANCELED: Novel Coronavirus, NAA ([facility])  Respiratory infection PND, chest tightness,   Fevers.   Rule out COVID with PCR strep and flu also in ddx.   Home and PCr test negative.  Will treat for sinusitis (bagterial) with amoxicillin, which patient already has/ .  Decongestants,  =sinus rinses,  recommende d    I discussed the assessment and treatment plan with the patient. The patient was provided an opportunity to ask questions and all were answered. The patient agreed with the plan and demonstrated an understanding of the instructions.   The patient was advised to call back or seek an in-person evaluation if the symptoms worsen or if the condition fails to improve as anticipated.   I spent 30 minutes dedicated to the care of this patient on the date of this encounter to include pre-visit review of his medical history,  Face-to-face time with the patient , and post visit ordering of testing and therapeutics.    Crecencio Mc, MD

## 2020-10-26 NOTE — Telephone Encounter (Signed)
Spoke with pt and scheduled both a video visit and a lab appt for covid testing.

## 2020-10-27 LAB — POCT RAPID STREP A (OFFICE): Rapid Strep A Screen: NEGATIVE

## 2020-10-28 LAB — COVID-19, FLU A+B AND RSV
Influenza A, NAA: NOT DETECTED
Influenza B, NAA: NOT DETECTED
RSV, NAA: NOT DETECTED
SARS-CoV-2, NAA: NOT DETECTED

## 2020-10-31 ENCOUNTER — Encounter: Payer: Self-pay | Admitting: Dermatology

## 2020-10-31 NOTE — Telephone Encounter (Signed)
Left message to call office need to triage. Have not been able to triage by phone.

## 2020-11-03 ENCOUNTER — Other Ambulatory Visit: Payer: Self-pay

## 2020-11-03 MED ORDER — THYROID 30 MG PO TABS: ORAL_TABLET | ORAL | 1 refills | Status: DC

## 2020-11-11 ENCOUNTER — Ambulatory Visit: Payer: BC Managed Care – PPO

## 2020-11-11 ENCOUNTER — Ambulatory Visit
Admission: EM | Admit: 2020-11-11 | Discharge: 2020-11-11 | Disposition: A | Discharge status: Home / Self Care | Payer: BC Managed Care – PPO | Attending: Emergency Medicine | Admitting: Emergency Medicine

## 2020-11-11 ENCOUNTER — Other Ambulatory Visit: Payer: Self-pay

## 2020-11-11 ENCOUNTER — Encounter: Payer: Self-pay | Admitting: Emergency Medicine

## 2020-11-11 DIAGNOSIS — J302 Other seasonal allergic rhinitis: Secondary | ICD-10-CM

## 2020-11-11 MED ORDER — PREDNISONE 10 MG (21) PO TBPK: ORAL_TABLET | Freq: Every day | ORAL | 0 refills | Status: DC

## 2020-11-11 NOTE — ED Provider Notes (Signed)
Carolyn Bass    CSN: [id number] Arrival date & time: 11/11/20  1311      History   Chief Complaint Chief Complaint  Patient presents with   Nasal Congestion   Cough     HPI Carolyn Bass is a 65 y.o. female.  Patient presents with 3-4 week history of postnasal drip.  The drainage is worse at night and is interrupting her sleep  She also has an occasional cough.  She denies fever, chills, congestion, shortness of breath, or other symptoms.  Patient had a video visit with her PCP on 10/26/2020; diagnosed with viral URI; negative for strep, flu, COVID at that time.  She also took left over amoxicillin she had at home.   Her medical history includes seasonal allergies, eustachian tube dysfunction, prediabetes, diarrhea, colon cancer, DVT, hypothyroidism.  The history is provided by the patient and medical records.   Past Medical History:  Diagnosis Date   Allergy    seasonal   Colon cancer ([place]) 1997   s/p resection, chemo   Colon polyp    DVT (deep venous thrombosis) ([place])    2/2 BCP   Encounter for preventive health examination 12/30/2014   Eustachian tube dysfunction 07/03/12   Hypertriglyceridemia without hypercholesterolemia    Hypothyroidism    On Armour. Did not tolerate synthroid   Prediabetes 02/29/12   Thyroid nodule 02/29/12    Patient Active Problem List   Diagnosis Date Noted   Contact dermatitis and eczema due to plant 04/05/2019   Acute non-recurrent frontal sinusitis 05/07/2018   Functional diarrhea 11/29/2017   Thrombocytopenia ([place]) 10/24/2017   History of colon cancer, stage III 10/23/2017   Respiratory infection 11/07/2015   Encounter for general adult medical examination with abnormal findings 12/30/2014   Vitamin B12 deficiency due to intestinal malabsorption 12/20/2014   Other specified hypothyroidism 11/12/2013   Vegetarian diet 11/12/2013   Hypertriglyceridemia without hypercholesterolemia 11/12/2013    Past Surgical History:   Procedure Laterality Date   COLON RESECTION Right 1997   COLONOSCOPY WITH PROPOFOL N/A 02/26/2018   Procedure: COLONOSCOPY WITH PROPOFOL;  Surgeon: Robert Bellow, MD;  Location: [facility] ENDOSCOPY;  Service: Endoscopy;  Laterality: N/A;   HERNIA REPAIR  2002   KNEE ARTHROSCOPY W/ ACL RECONSTRUCTION Left 1990   TONSILLECTOMY AND ADENOIDECTOMY  1962   TUBAL LIGATION  2001    OB History     Gravida  1   Para  0   Term      Preterm      AB  1   Living         SAB  1   IAB      Ectopic      Multiple      Live Births           Obstetric Comments  1st Menstrual Cycle:  12  1st Pregnancy:  23           Home Medications    Prior to Admission medications   Medication Sig Start Date End Date Taking? Authorizing Provider  predniSONE (STERAPRED UNI-PAK 21 TAB) 10 MG (21) TBPK tablet Take by mouth daily. As directed 11/11/20  Yes Sharion Balloon, NP  diphenoxylate-atropine (LOMOTIL) 2.5-0.025 MG tablet Take 1 tablet by mouth 4 (four) times daily as needed for diarrhea or loose stools. 10/11/20   Crecencio Mc, MD  Multiple Vitamins-Minerals (MULTIVITAMIN ADULTS 50+ PO) Take 1 tablet by mouth daily.    [provider]  thyroid (ARMOUR THYROID) 30 MG tablet TAKE 1 TABLET BY MOUTH DAILY BEFORE BREAKFAST 11/03/20   Crecencio Mc, MD    Family History Family History  Problem Relation Age of Onset   Hyperlipidemia Brother    Diabetes Son     Social History Social History   Tobacco Use   Smoking status: Never   Smokeless tobacco: Never  Substance Use Topics   Alcohol use: Yes    Comment: 1 drink weekly   Drug use: No     Allergies   Synthroid [levothyroxine sodium], Penicillins, and Tetracyclines & related   Review of Systems Review of Systems  Constitutional:  Negative for chills and fever.  HENT:  Positive for postnasal drip. Negative for ear pain and sore throat.   Respiratory:  Positive for cough. Negative for shortness of breath.    Cardiovascular:  Negative for chest pain and palpitations.  Gastrointestinal:  Negative for abdominal pain, diarrhea and vomiting.  Skin:  Negative for color change and rash.  All other systems reviewed and are negative.   Physical Exam Triage Vital Signs ED Triage Vitals  Enc Vitals Group     BP      Pulse      Resp      Temp      Temp src      SpO2      Weight      Height      Head Circumference      Peak Flow      Pain Score      Pain Loc      Pain Edu?      Excl. in [place]?    No data found.  Updated Vital Signs BP (!) 146/97 (BP Location: Right Arm)   Pulse 87   Temp 98.8 F (37.1 C) (Oral)   Resp 20   SpO2 96%   Visual Acuity Right Eye Distance:   Left Eye Distance:   Bilateral Distance:    Right Eye Near:   Left Eye Near:    Bilateral Near:     Physical Exam Vitals and nursing note reviewed.  Constitutional:      General: She is not in acute distress.    Appearance: She is well-developed. She is not ill-appearing.  HENT:     Head: Normocephalic and atraumatic.     Right Ear: Tympanic membrane normal.     Left Ear: Tympanic membrane normal.     Nose: Nose normal.     Mouth/Throat:     Mouth: Mucous membranes are moist.     Pharynx: Oropharynx is clear.     Comments: Clear postnasal drip. Eyes:     Conjunctiva/sclera: Conjunctivae normal.  Cardiovascular:     Rate and Rhythm: Normal rate and regular rhythm.     Heart sounds: Normal heart sounds.  Pulmonary:     Effort: Pulmonary effort is normal. No respiratory distress.     Breath sounds: Normal breath sounds.  Abdominal:     Palpations: Abdomen is soft.     Tenderness: There is no abdominal tenderness.  Musculoskeletal:     Cervical back: Neck supple.  Skin:    General: Skin is warm and dry.  Neurological:     General: No focal deficit present.     Mental Status: She is alert and oriented to person, place, and time.     Gait: Gait normal.  Psychiatric:        Mood and Affect: Mood  normal.  Behavior: Behavior normal.     UC Treatments / Results  Labs (all labs ordered are listed, but only abnormal results are displayed) Labs Reviewed - No data to display  EKG   Radiology No results found.  Procedures Procedures (including critical care time)  Medications Ordered in UC Medications - No data to display  Initial Impression / Assessment and Plan / UC Course  I have reviewed the triage vital signs and the nursing notes.  Pertinent labs & imaging results that were available during my care of the patient were reviewed by me and considered in my medical decision making (see chart for details).   Seasonal allergies.  Treating with prednisone taper.  Instructed patient to continue taking Xyzal.  Instructed her to follow-up with her PCP or an ENT if her symptoms are not improving.  She agrees to plan of care.   Final Clinical Impressions(s) / UC Diagnoses   Final diagnoses:  Seasonal allergies     Discharge Instructions      Take the prednisone as directed.  Continue taking Xyzal.    Follow up with your primary care provider or ENT if your symptoms are not improving.        ED Prescriptions     Medication Sig Dispense Auth. Provider   predniSONE (STERAPRED UNI-PAK 21 TAB) 10 MG (21) TBPK tablet Take by mouth daily. As directed 21 tablet Sharion Balloon, NP      I have reviewed the PDMP during this encounter.   Sharion Balloon, NP 11/11/20 1401

## 2020-11-11 NOTE — Discharge Instructions (Addendum)
Take the prednisone as directed.  Continue taking Xyzal.    Follow up with your primary care provider or ENT if your symptoms are not improving.

## 2020-11-11 NOTE — ED Triage Notes (Addendum)
Pt presents today with c/o of post nasal drip, cough and swollen neck glands x 1 month. Difficulty with sleep.   She was tested for Flu, Covid and Strep and all neg. She then completed a course of antibiotics.

## 2020-11-14 ENCOUNTER — Ambulatory Visit: Payer: BC Managed Care – PPO | Admitting: Adult Health

## 2020-12-28 ENCOUNTER — Other Ambulatory Visit (HOSPITAL_COMMUNITY)
Admission: RE | Admit: 2020-12-28 | Discharge: 2020-12-28 | Disposition: A | Discharge status: Home / Self Care | Payer: Medicare Other | Source: Ambulatory Visit | Attending: Internal Medicine | Admitting: Internal Medicine

## 2020-12-28 ENCOUNTER — Ambulatory Visit (INDEPENDENT_AMBULATORY_CARE_PROVIDER_SITE_OTHER): Payer: Medicare Other | Admitting: Internal Medicine

## 2020-12-28 ENCOUNTER — Encounter: Payer: Self-pay | Admitting: Internal Medicine

## 2020-12-28 ENCOUNTER — Other Ambulatory Visit: Payer: Self-pay

## 2020-12-28 VITALS — BP 122/86 | HR 90 | Temp 96.4°F | Resp 15 | Ht 68.0 in | Wt 167.4 lb

## 2020-12-28 DIAGNOSIS — Z0000 Encounter for general adult medical examination without abnormal findings: Secondary | ICD-10-CM | POA: Diagnosis not present

## 2020-12-28 DIAGNOSIS — Z124 Encounter for screening for malignant neoplasm of cervix: Secondary | ICD-10-CM | POA: Insufficient documentation

## 2020-12-28 DIAGNOSIS — D696 Thrombocytopenia, unspecified: Secondary | ICD-10-CM | POA: Diagnosis not present

## 2020-12-28 DIAGNOSIS — Z1151 Encounter for screening for human papillomavirus (HPV): Secondary | ICD-10-CM | POA: Insufficient documentation

## 2020-12-28 DIAGNOSIS — E781 Pure hyperglyceridemia: Secondary | ICD-10-CM

## 2020-12-28 DIAGNOSIS — Z01419 Encounter for gynecological examination (general) (routine) without abnormal findings: Secondary | ICD-10-CM | POA: Diagnosis present

## 2020-12-28 DIAGNOSIS — Z85038 Personal history of other malignant neoplasm of large intestine: Secondary | ICD-10-CM

## 2020-12-28 DIAGNOSIS — Z1231 Encounter for screening mammogram for malignant neoplasm of breast: Secondary | ICD-10-CM

## 2020-12-28 NOTE — Progress Notes (Addendum)
The patient is here for the Welcome to  Medicare  preventive visit     has a past medical history of Allergy, Colon cancer ([place]) (1997), Colon polyp, DVT (deep venous thrombosis) ([place]), Encounter for preventive health examination (12/30/2014), Eustachian tube dysfunction (07/03/12), Hypertriglyceridemia without hypercholesterolemia, Hypothyroidism, Prediabetes (02/29/12), and Thyroid nodule (02/29/12).    reports that she has never smoked. She has never used smokeless tobacco. She reports current alcohol use. She reports that she does not use drugs.   The roster of all physicians providing medical care to patient : Patient Care Team: Crecencio Mc, MD as PCP - General (Internal Medicine)  Activities of daily living:  The patient is 100% independent in all ADLs: dressing, toileting, feeding as well as independent mobility Fall risk was assessed by direct patient evaluation of patient's balance, gait and ability to rise from a chair and from a kneeling position. Home safety : The patient has smoke detectors in the home. They wear seatbelts.  There are no firearms at home. There is no violence in the home.  Patient has seen their eye doctor in the last year.   Visual acuity was assessed today  and was 20/20 with correction lenses. Patient denies hearing difficulty with regard to whispered voices and some television programs and has  deferred audiologic testing in the last year.   There is no risks for hepatitis, STDs or HIV. There is no   history of blood transfusion. They have no travel history to infectious disease endemic areas of the world.  The patient has seen their dentist in the last six month.  They do not  have excessive sun exposure. Discussed the need for sun protection: hats, long sleeves and use of sunscreen if there is significant sun exposure.   Diet: the importance of a healthy diet is discussed. They do have a healthy diet.  The benefits of regular aerobic exercise were  discussed. Patient walks and kayaks  a minimum of 4 times per week ,  a minimum of 60 minutes.   Vision Exam:  both eyes open 20/15  left   20/15  right  20/20 w/correction   Depression screen:   Depression screen [facility] 2/9 12/28/2020 10/26/2020  Decreased Interest 0 0  Down, Depressed, Hopeless 0 0  PHQ - 2 Score 0 0       Cognitive assessment: the patient manages all their financial and personal affairs and is actively engaged. They could relate day,date,year and events; recalled 2/3 objects at 3 minutes; performed clock-face test normally.  The following portions of the patient's history were reviewed and updated as appropriate: allergies, current medications, past family history, past medical history,  past surgical history, past social history  and problem list.  During the course of the visit the patient was educated and counseled about appropriate screening and preventive services including : fall prevention , diabetes screening, nutrition counseling, colorectal cancer screening, and recommended immunizations   Immunization History  Administered Date(s) Administered   Moderna Sars-Covid-2 Vaccination 05/23/2020   PFIZER(Purple Top)SARS-COV-2 Vaccination 09/07/2019, 09/28/2019   Td 06/17/2019   Tdap 05/22/2009   Zoster Recombinat (Shingrix) 12/31/2019, 03/14/2020  HMLISTPATIENTFRIENDLY@ Health Maintenance Due  Topic Date Due   COVID-19 Vaccine (4 - Booster) 08/15/2020   INFLUENZA VACCINE  Never done   DEXA SCAN  Never done    Last skin cancer screening : Jan 2022 Forest Gleason, MD      Vital Signs: BP 122/86 (BP Location: Left Arm, Patient Position: Sitting,  Cuff Size: Normal)   Pulse 90   Temp (!) 96.4 F (35.8 C) (Temporal)   Resp 15   Ht '5\' 8"'$  (1.727 m)   Wt 167 lb 6.4 oz (75.9 kg)   SpO2 97%   BMI 25.45 kg/m    Exam:  General Appearance:    Alert, cooperative, no distress, appears stated age  Head:    Normocephalic, without obvious abnormality, atraumatic   Eyes:    PERRL, conjunctiva/corneas clear, EOM's intact, fundi    benign, both eyes  Ears:    Normal TM's and external ear canals, both ears  Nose:   Nares normal, septum midline, mucosa normal, no drainage    or sinus tenderness  Throat:   Lips, mucosa, and tongue normal; teeth and gums normal  Neck:   Supple, symmetrical, trachea midline, no adenopathy;    thyroid:  no enlargement/tenderness/nodules; no carotid   bruit or JVD  Back:     Symmetric, no curvature, ROM normal, no CVA tenderness  Lungs:     Clear to auscultation bilaterally, respirations unlabored  Chest Wall:    No tenderness or deformity   Heart:    Regular rate and rhythm, S1 and S2 normal, no murmur, rub   or gallop  Breast Exam:    No tenderness, masses, or nipple abnormality  Abdomen:     Soft, non-tender, bowel sounds active all four quadrants,    no masses, no organomegaly  Genitalia:    Pelvic: cervix normal in appearance, external genitalia normal, no adnexal masses or tenderness, no cervical motion tenderness, rectovaginal septum normal, uterus normal size, shape, and consistency and vagina normal without discharge  Extremities:   Extremities normal, atraumatic, no cyanosis or edema  Pulses:   2+ and symmetric all extremities  Skin:   Skin color, texture, turgor normal, no rashes or lesions  Lymph nodes:   Cervical, supraclavicular, and axillary nodes normal  Neurologic:   CNII-XII intact, normal strength, sensation and reflexes    throughout       End of Life Discussion and Planning   During the course of the visit , End of Life objectives were discussed at length.  Patient does not have a living will in place or a healthcare power of attorney.  Patient  was given printed information about advance directives and encouraged to return after discussing with their family.  Review of Opioid Prescriptions    Patient does not have a current opioid prescription Patient risk for potential substance abuse was  assessed and addressed with counselling.    Assessment and Plan  Thrombocytopenia ([place]) Resolved spontaneously  Lab Results  Component Value Date   WBC 4.0 06/17/2019   HGB 13.5 06/17/2019   HCT 41.4 06/17/2019   MCV 87.0 06/17/2019   PLT 163.0 06/17/2019     Hypertriglyceridemia without hypercholesterolemia Lipids are now well controlled.    Lab Results  Component Value Date   CHOL 179 07/01/2020   HDL 63.80 07/01/2020   LDLCALC 88 07/01/2020   LDLDIRECT 77.0 06/20/2016   TRIG 138.0 07/01/2020   CHOLHDL 3 07/01/2020     History of colon cancer, stage III Dioagnosed and treated at age 65 with colonic resection and chemo per patient while residing in [place]. Current guidelines from all major groups recommend surveillance colonoscopy every 5 years , which was done in 2019;  no polyps were recovered.   Welcome to Medicare preventive visit age appropriate education and counseling updated, referrals for preventative services and immunizations addressed,  dietary and smoking counseling addressed, most recent labs reviewed.  I have personally reviewed and have noted:   1) the patient's medical and social history 2) The pt's use of alcohol, tobacco, and illicit drugs 3) The patient's current medications and supplements 4) Functional ability including ADL's, fall risk, home safety risk, hearing and visual impairment 5) Diet and physical activities 6) Evidence for depression or mood disorder 7) The patient's height, weight, and BMI have been recorded in the chart 8) I have ordered and reviewed a 12 lead EKG and find that there are no acute changes and patient is in sinus rhythm.     I have made referrals, and provided counseling and education based on review of the above Updated Medication List Outpatient Encounter Medications as of 12/28/2020  Medication Sig   diphenoxylate-atropine (LOMOTIL) 2.5-0.025 MG tablet Take 1 tablet by mouth 4 (four) times daily as needed for  diarrhea or loose stools.   Multiple Vitamins-Minerals (MULTIVITAMIN ADULTS 50+ PO) Take 1 tablet by mouth daily.   [DISCONTINUED] thyroid (ARMOUR THYROID) 30 MG tablet TAKE 1 TABLET BY MOUTH DAILY BEFORE BREAKFAST   [DISCONTINUED] predniSONE (STERAPRED UNI-PAK 21 TAB) 10 MG (21) TBPK tablet Take by mouth daily. As directed (Patient not taking: Reported on 12/28/2020)   No facility-administered encounter medications on file as of 12/28/2020.

## 2020-12-28 NOTE — Patient Instructions (Addendum)
I have ordered your mammogram to be done at [place]  PAP smear should be resulted in 3-4 days

## 2020-12-30 LAB — CYTOLOGY - PAP
Chlamydia: NEGATIVE
Comment: NEGATIVE
Comment: NEGATIVE
Diagnosis: NEGATIVE
High risk HPV: NEGATIVE

## 2020-12-31 NOTE — Assessment & Plan Note (Signed)
Dioagnosed and treated at age 65 with colonic resection and chemo per patient while residing in [place]. Current guidelines from all major groups recommend surveillance colonoscopy every 5 years , which was done in 2019;  no polyps were recovered.

## 2020-12-31 NOTE — Assessment & Plan Note (Signed)
Lipids are now well controlled.    Lab Results  Component Value Date   CHOL 179 07/01/2020   HDL 63.80 07/01/2020   LDLCALC 88 07/01/2020   LDLDIRECT 77.0 06/20/2016   TRIG 138.0 07/01/2020   CHOLHDL 3 07/01/2020

## 2020-12-31 NOTE — Assessment & Plan Note (Signed)
Resolved spontaneously  Lab Results  Component Value Date   WBC 4.0 06/17/2019   HGB 13.5 06/17/2019   HCT 41.4 06/17/2019   MCV 87.0 06/17/2019   PLT 163.0 06/17/2019

## 2021-01-18 ENCOUNTER — Other Ambulatory Visit: Payer: Self-pay

## 2021-01-18 MED ORDER — THYROID 30 MG PO TABS: ORAL_TABLET | ORAL | 1 refills | Status: DC

## 2021-02-01 ENCOUNTER — Telehealth: Payer: Self-pay | Admitting: Internal Medicine

## 2021-02-01 MED ORDER — THYROID 30 MG PO TABS: ORAL_TABLET | ORAL | 1 refills | Status: DC

## 2021-02-01 NOTE — Telephone Encounter (Signed)
Kiya,pharmacy technician, from is [facility] is calling in to check on the status of a refill request for the patient for thyroid (ARMOUR THYROID) 30 MG tablet sent in on 01/29/21.Please call her at [phone].

## 2021-02-01 NOTE — Telephone Encounter (Signed)
Rx has been sent to [facility]

## 2021-02-10 ENCOUNTER — Other Ambulatory Visit: Payer: Self-pay

## 2021-02-10 ENCOUNTER — Ambulatory Visit
Admission: RE | Admit: 2021-02-10 | Discharge: 2021-02-10 | Disposition: A | Discharge status: Home / Self Care | Payer: Medicare Other | Source: Ambulatory Visit | Attending: Internal Medicine | Admitting: Internal Medicine

## 2021-02-10 DIAGNOSIS — Z1231 Encounter for screening mammogram for malignant neoplasm of breast: Secondary | ICD-10-CM

## 2021-02-20 NOTE — Assessment & Plan Note (Signed)

## 2021-07-28 ENCOUNTER — Other Ambulatory Visit: Payer: Self-pay | Admitting: Internal Medicine

## 2021-10-18 ENCOUNTER — Telehealth: Payer: Self-pay | Admitting: Internal Medicine

## 2021-10-18 MED ORDER — THYROID 30 MG PO TABS: ORAL_TABLET | ORAL | 0 refills | Status: DC

## 2021-10-18 NOTE — Telephone Encounter (Signed)
Carolyn Bass from [facility] called about authorization for medication-thyroid

## 2021-10-18 NOTE — Telephone Encounter (Signed)
Medication has been refilled for 90 days only.

## 2021-10-26 ENCOUNTER — Ambulatory Visit: Payer: BC Managed Care – PPO | Admitting: Dermatology

## 2021-11-05 IMAGING — MG MM DIGITAL SCREENING BILAT W/ TOMO AND CAD
8 series · 8 of 24 positions shown · non-contrast
Comparison: Previous exam(s).

CLINICAL DATA: Screening.

EXAM:
DIGITAL SCREENING BILATERAL MAMMOGRAM WITH TOMOSYNTHESIS AND CAD
TECHNIQUE: Bilateral screening digital craniocaudal and mediolateral oblique
mammograms were obtained. Bilateral screening digital breast
tomosynthesis was performed. The images were evaluated with
computer-aided detection.

[L CC synth-2D]
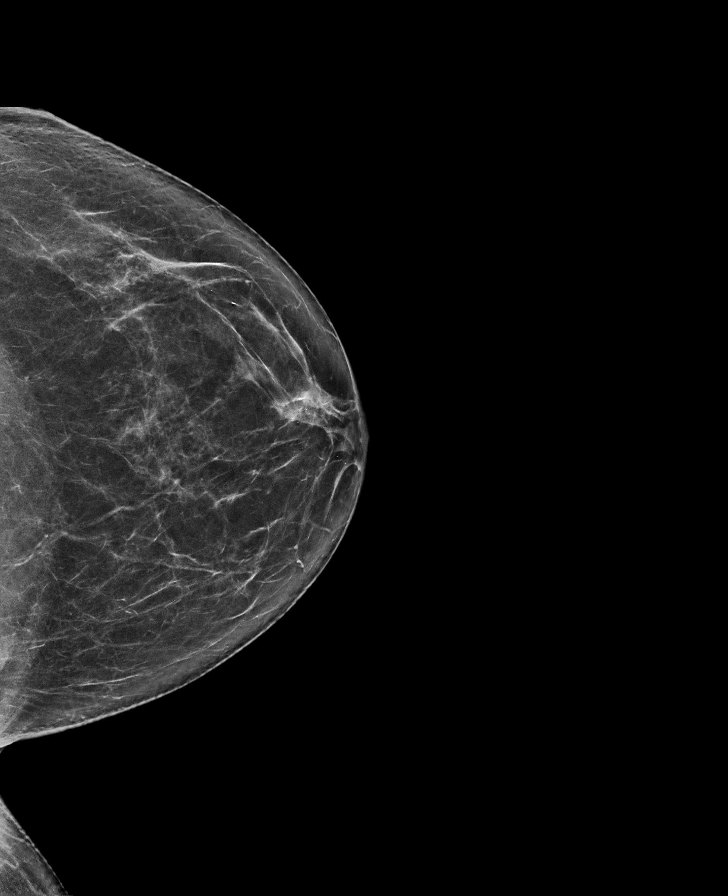

[R MLO synth-2D]
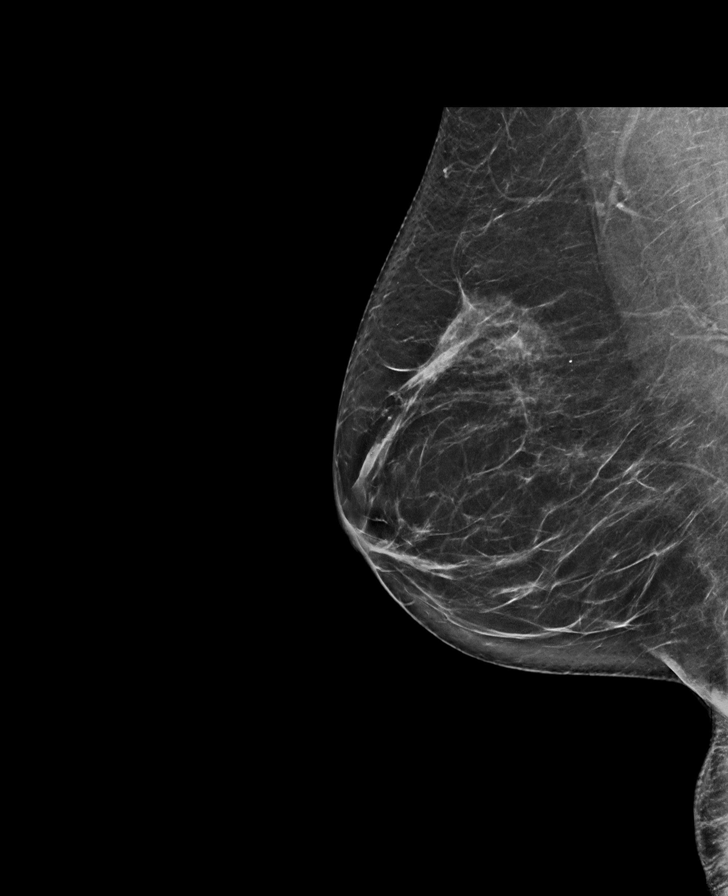

[L MLO synth-2D]
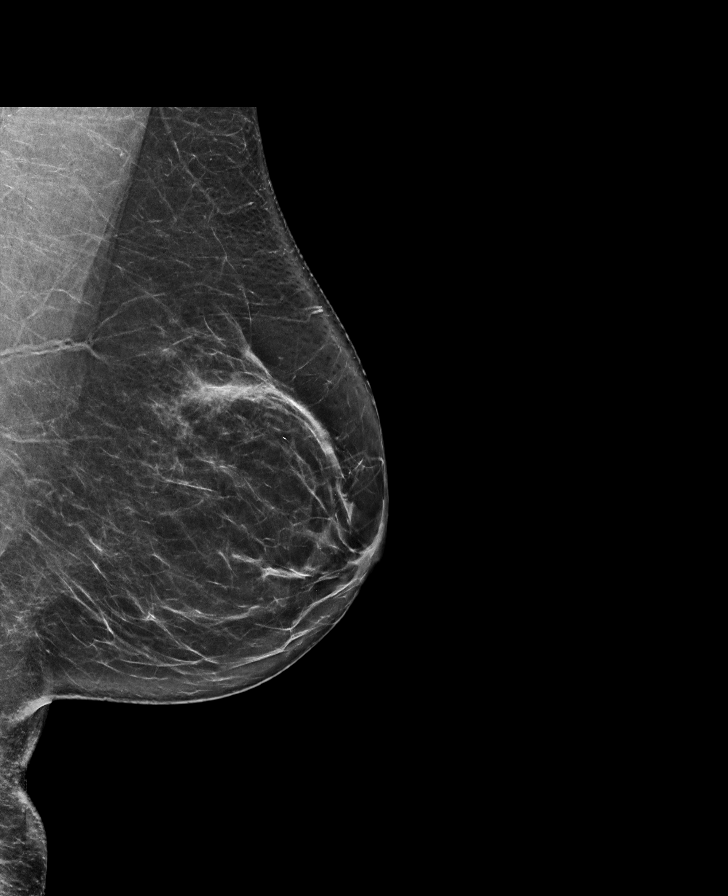

[R CC synth-2D]
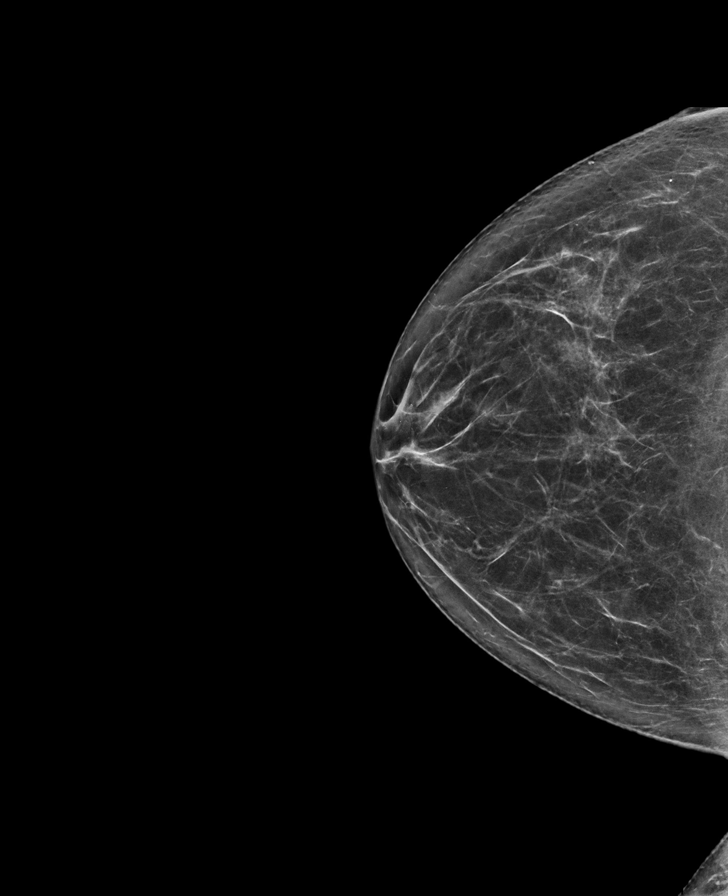

[R CC tomo · tomo slice 36/71.0]
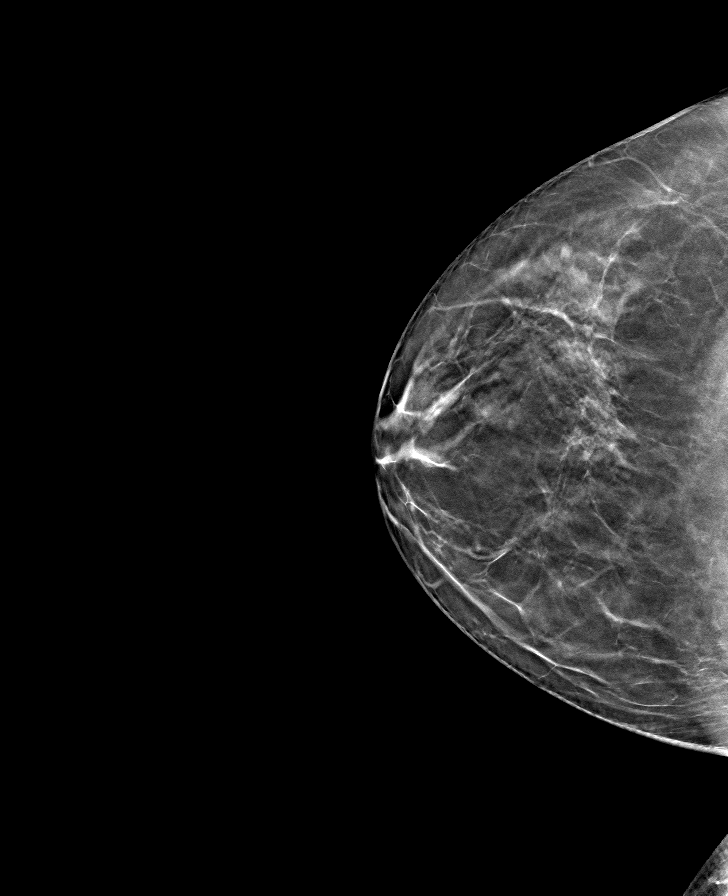

[R MLO tomo · tomo slice 39/78.0]
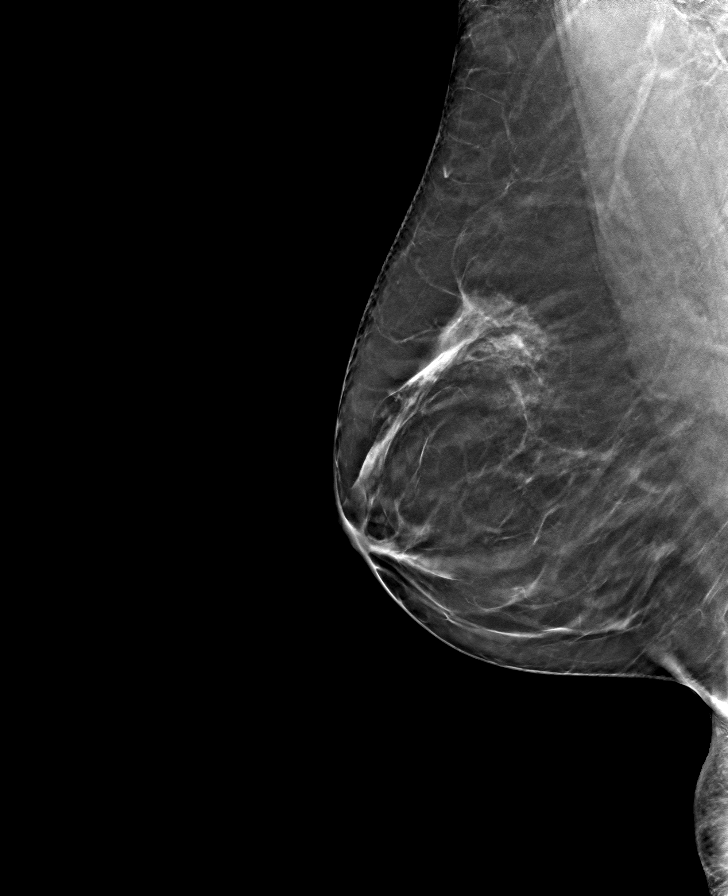

[L CC tomo · tomo slice 37/72.0]
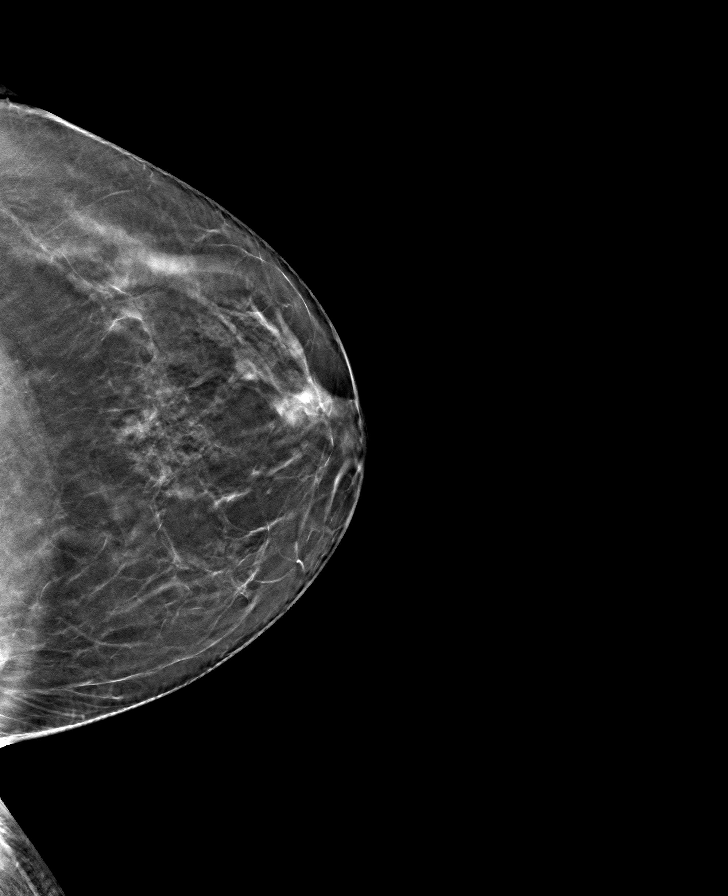

[L MLO tomo · tomo slice 40/79.0]
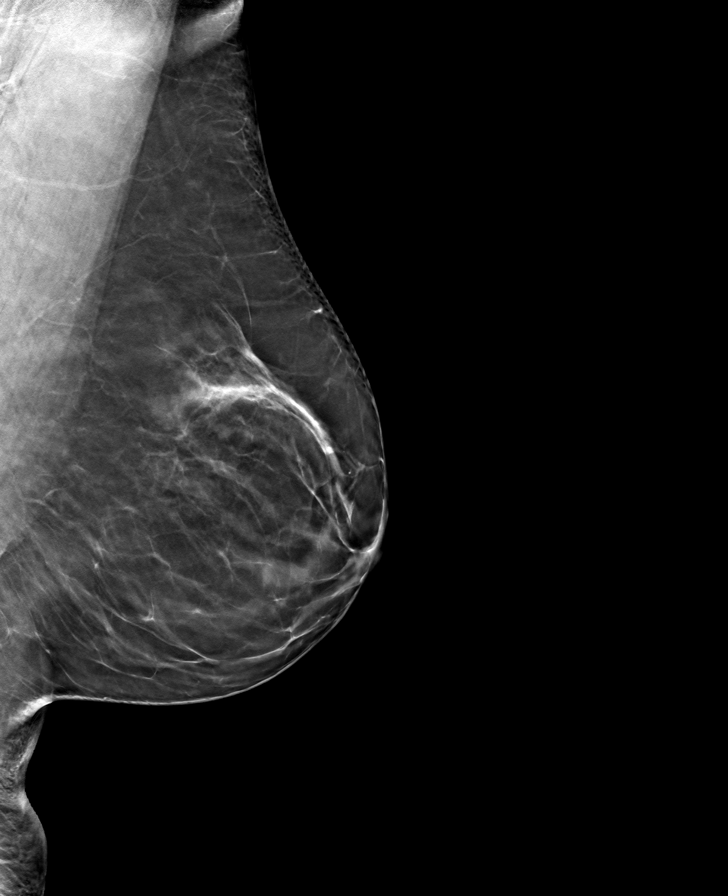

[8 of 24 positions shown; findings below may reference images not displayed]

ACR Breast Density Category b: There are scattered areas of
fibroglandular density.
FINDINGS: There are no findings suspicious for malignancy.
IMPRESSION: No mammographic evidence of malignancy. A result letter of this
screening mammogram will be mailed directly to the patient.

RECOMMENDATION:
Screening mammogram in one year. (Code:51-O-LD2)

BI-RADS CATEGORY  1: Negative.

## 2021-11-24 ENCOUNTER — Other Ambulatory Visit: Payer: Self-pay

## 2021-11-24 ENCOUNTER — Encounter: Payer: Self-pay | Admitting: Internal Medicine

## 2021-11-26 MED ORDER — NAPROXEN 500 MG PO TABS
500.0000 mg | ORAL_TABLET | Freq: Two times a day (BID) | ORAL | 0 refills | Status: DC
Start: 2021-11-26 — End: 2022-02-09

## 2021-12-07 ENCOUNTER — Ambulatory Visit (INDEPENDENT_AMBULATORY_CARE_PROVIDER_SITE_OTHER): Payer: Medicare Other | Admitting: Dermatology

## 2021-12-07 ENCOUNTER — Encounter: Payer: Self-pay | Admitting: Dermatology

## 2021-12-07 DIAGNOSIS — Z1283 Encounter for screening for malignant neoplasm of skin: Secondary | ICD-10-CM

## 2021-12-07 DIAGNOSIS — D229 Melanocytic nevi, unspecified: Secondary | ICD-10-CM

## 2021-12-07 DIAGNOSIS — L578 Other skin changes due to chronic exposure to nonionizing radiation: Secondary | ICD-10-CM

## 2021-12-07 DIAGNOSIS — L570 Actinic keratosis: Secondary | ICD-10-CM | POA: Diagnosis not present

## 2021-12-07 DIAGNOSIS — L821 Other seborrheic keratosis: Secondary | ICD-10-CM | POA: Diagnosis not present

## 2021-12-07 DIAGNOSIS — L814 Other melanin hyperpigmentation: Secondary | ICD-10-CM | POA: Diagnosis not present

## 2021-12-07 DIAGNOSIS — D2371 Other benign neoplasm of skin of right lower limb, including hip: Secondary | ICD-10-CM

## 2021-12-07 DIAGNOSIS — D1800 Hemangioma unspecified site: Secondary | ICD-10-CM

## 2021-12-07 NOTE — Patient Instructions (Addendum)

## 2021-12-07 NOTE — Progress Notes (Signed)
Follow-Up Visit   Subjective  Carolyn Bass is a 66 y.o. female who presents for the following: Annual Exam (Hx AK's that were treated in the past with topical chemotherapy creams - patient has noticed a lesion on her R calf that is getting darker).  The patient has spots, moles and lesions to be evaluated, some may be new or changing and the patient has concerns that these could be cancer.   The following portions of the chart were reviewed this encounter and updated as appropriate:   Tobacco  Allergies  Meds  Problems  Med Hx  Surg Hx  Fam Hx      Review of Systems:  No other skin or systemic complaints except as noted in HPI or Assessment and Plan.  Objective  Well appearing patient in no apparent distress; mood and affect are within normal limits.  A full examination was performed including scalp, head, eyes, ears, nose, lips, neck, chest, axillae, abdomen, back, buttocks, bilateral upper extremities, bilateral lower extremities, hands, feet, fingers, toes, fingernails, and toenails. All findings within normal limits unless otherwise noted below.  R nose x 1 Erythematous thin papules/macules with gritty scale.     Assessment & Plan  AK (actinic keratosis) R nose x 1  Actinic keratoses are precancerous spots that appear secondary to cumulative UV radiation exposure/sun exposure over time. They are chronic with expected duration over 1 year. A portion of actinic keratoses will progress to squamous cell carcinoma of the skin. It is not possible to reliably predict which spots will progress to skin cancer and so treatment is recommended to prevent development of skin cancer.  Recommend daily broad spectrum sunscreen SPF 30+ to sun-exposed areas, reapply every 2 hours as needed.  Recommend staying in the shade or wearing long sleeves, sun glasses (UVA+UVB protection) and wide brim hats (4-inch brim around the entire circumference of the hat). Call for new or changing  lesions.  Prior to procedure, discussed risks of blister formation, small wound, skin dyspigmentation, or rare scar following cryotherapy. Recommend Vaseline ointment to treated areas while healing.    Destruction of lesion - R nose x 1 Complexity: simple   Destruction method: cryotherapy   Informed consent: discussed and consent obtained   Timeout:  patient name, date of birth, surgical site, and procedure verified Lesion destroyed using liquid nitrogen: Yes   Region frozen until ice ball extended beyond lesion: Yes   Outcome: patient tolerated procedure well with no complications   Post-procedure details: wound care instructions given     Lentigines - Scattered tan macules - Due to sun exposure - Benign-appearing, observe - Recommend daily broad spectrum sunscreen SPF 30+ to sun-exposed areas, reapply every 2 hours as needed. - Call for any changes  Seborrheic Keratoses - Stuck-on, waxy, tan-brown papules and/or plaques  - Benign-appearing - Discussed benign etiology and prognosis. - Observe - Call for any changes  Melanocytic Nevi - Tan-brown and/or pink-flesh-colored symmetric macules and papules - Benign appearing on exam today - Observation - Call clinic for new or changing moles - Recommend daily use of broad spectrum spf 30+ sunscreen to sun-exposed areas.   Hemangiomas - Red papules - Discussed benign nature - Observe - Call for any changes  Actinic Damage - Chronic condition, secondary to cumulative UV/sun exposure - diffuse scaly erythematous macules with underlying dyspigmentation - Recommend daily broad spectrum sunscreen SPF 30+ to sun-exposed areas, reapply every 2 hours as needed.  - Staying in the shade or wearing  long sleeves, sun glasses (UVA+UVB protection) and wide brim hats (4-inch brim around the entire circumference of the hat) are also recommended for sun protection.  - Call for new or changing lesions.  Dermatofibroma - R calf  - Firm  pink/brown papulenodule with dimple sign - Benign appearing - Call for any changes  Skin cancer screening performed today.  Return in about 1 year (around 12/08/2022) for TBSE.  Luther Redo, CMA, am acting as scribe for Forest Gleason, MD .  Documentation: I have reviewed the above documentation for accuracy and completeness, and I agree with the above.  Forest Gleason, MD

## 2021-12-28 ENCOUNTER — Telehealth: Payer: Self-pay | Admitting: Internal Medicine

## 2021-12-28 NOTE — Telephone Encounter (Signed)
Spoke with patient she was out of town req CB next week 01/01/22

## 2021-12-29 ENCOUNTER — Other Ambulatory Visit: Payer: Self-pay | Admitting: Internal Medicine

## 2022-01-09 ENCOUNTER — Telehealth: Payer: Self-pay | Admitting: Internal Medicine

## 2022-01-09 NOTE — Telephone Encounter (Signed)
Copied from [place] [id number]. Topic: Medicare AWV >> Jan 09, 2022  2:05 PM Devoria Glassing wrote: Reason for CRM: Left message for patient to schedule Annual Wellness Visit.  Please schedule with Nurse Health Advisor Denisa O'Brien-Blaney, LPN at [facility]. This appt can be telephone or office visit.  Please call [phone] ask for [facility]

## 2022-01-11 ENCOUNTER — Ambulatory Visit (INDEPENDENT_AMBULATORY_CARE_PROVIDER_SITE_OTHER): Payer: Medicare Other

## 2022-01-11 VITALS — Ht 68.0 in | Wt 167.0 lb

## 2022-01-11 DIAGNOSIS — Z0000 Encounter for general adult medical examination without abnormal findings: Secondary | ICD-10-CM

## 2022-01-11 NOTE — Patient Instructions (Addendum)
  Carolyn Bass , Thank you for taking time to come for your Medicare Wellness Visit. I appreciate your ongoing commitment to your health goals. Please review the following plan we discussed and let me know if I can assist you in the future.   These are the goals we discussed:  Goals      Maintain Healthy Lifestyle     Stay active Healthy diet        This is a list of the screening recommended for you and due dates:  Health Maintenance  Topic Date Due   COVID-19 Vaccine (4 - Mixed Product risk series) 01/27/2022*   Pneumonia Vaccine (1 - PCV) 02/18/2022*   DEXA scan (bone density measurement)  02/18/2022*   Mammogram  02/11/2023   Pap Smear  12/29/2023   Colon Cancer Screening  02/27/2028   Tetanus Vaccine  06/16/2029   Hepatitis C Screening: USPSTF Recommendation to screen - Ages 18-79 yo.  Completed   HIV Screening  Completed   Zoster (Shingles) Vaccine  Completed   HPV Vaccine  Aged Out   Flu Shot  Discontinued  *Topic was postponed. The date shown is not the original due date.

## 2022-01-11 NOTE — Progress Notes (Addendum)
Subjective:   Carolyn Bass is a 66 y.o. female who presents for an Initial Medicare Annual Wellness Visit.  Review of Systems    No ROS.  Medicare Wellness Virtual Visit.  Visual/audio telehealth visit, UTA vital signs.   See social history for additional risk factors.   Cardiac Risk Factors include: advanced age (>68mn, >>75women)     Objective:    Today's Vitals   01/11/22 0918  Weight: 167 lb (75.8 kg)  Height: '5\' 8"'$  (1.727 m)   Body mass index is 25.39 kg/m.     01/11/2022    9:21 AM 02/26/2018    7:12 AM  Advanced Directives  Does Patient Have a Medical Advance Directive? Yes Yes  Type of AParamedicof AHeathLiving will   Does patient want to make changes to medical advance directive? No - Patient declined   Copy of [place]? No - copy requested     Current Medications (verified) Outpatient Encounter Medications as of 01/11/2022  Medication Sig   diphenoxylate-atropine (LOMOTIL) 2.5-0.025 MG tablet Take 1 tablet by mouth 4 (four) times daily as needed for diarrhea or loose stools.   Multiple Vitamins-Minerals (MULTIVITAMIN ADULTS 50+ PO) Take 1 tablet by mouth daily.   naproxen (NAPROSYN) 500 MG tablet Take 1 tablet (500 mg total) by mouth 2 (two) times daily with a meal.   thyroid (ARMOUR THYROID) 30 MG tablet Take 1 tablet by mouth daily before breakfast.   No facility-administered encounter medications on file as of 01/11/2022.    Allergies (verified) Synthroid [levothyroxine sodium], Penicillins, and Tetracyclines & related   History: Past Medical History:  Diagnosis Date   Allergy    seasonal   Col[place]   s/p resection, chemo   Colon polyp    DVT (deep venous thrombosis) (HCC)    2/2 BCP   Encounter for preventive health examination 12/30/2014   Eustachian tube dysfunction 07/03/12   Hypertriglyceridemia without hypercholesterolemia    Hypothyroidism    On Armour. Did not  tolerate synthroid   Prediabetes 02/29/12   Thyroid nodule 02/29/12   Past Surgical History:  Procedure Laterality Date   COLON RESECTION Right 1997   COLONOSCOPY WITH PROPOFOL N/A 02/26/2018   Procedure: COLONOSCOPY WITH PROPOFOL;  Surgeon: BRobert Bellow MD;  Location: [facility] ENDOSCOPY;  Service: Endoscopy;  Laterality: N/A;   HERNIA REPAIR  2002   KNEE ARTHROSCOPY W/ ACL RECONSTRUCTION Left 1990   TONSILLECTOMY AND ADENOIDECTOMY  1962   TUBAL LIGATION  2001   Family History  Problem Relation Age of Onset   Hyperlipidemia Brother    Diabetes Son    Social History   Socioeconomic History   Marital status: Single    Spouse name: Not on file   Number of children: 0   Years of education: 18   Highest education level: Not on file  Occupational History   Occupation: SRetail buyer   Comment: Retired  Tobacco Use   Smoking status: Never   Smokeless tobacco: Never  Substance and Sexual Activity   Alcohol use: Yes    Comment: 1 drink weekly   Drug use: No   Sexual activity: Not on file  Other Topics Concern   Not on file  Social History Narrative   [place]. She has an MLoss adjuster, chartered Retired from MTour manager      Hobbies: cross  country ski, gardening, travel, reading, sewing   Caffeine: tea 2 cups daily   Exercise: [employer]. Workouts 4x/week - cardio, weight training   Diet: pescetarian   Social Determinants of Health   Financial Resource Strain: Low Risk  (01/11/2022)   Overall Financial Resource Strain (CARDIA)    Difficulty of Paying Living Expenses: Not hard at all  Food Insecurity: No Food Insecurity (01/11/2022)   Hunger Vital Sign    Worried About Running Out of Food in the Last Year: Never true    [place] in the Last Year: Never true  Transportation Needs: No Transportation Needs (01/11/2022)   PRAPARE - Hydrologist (Medical): No    Lack of  Transportation (Non-Medical): No  Physical Activity: Unknown (01/11/2022)   Exercise Vital Sign    Days of Exercise per Week: Not on file    Minutes of Exercise per Session: 150+ min  Stress: No Stress Concern Present (01/11/2022)   [place]    Feeling of Stress : Not at all  Social Connections: Unknown (01/11/2022)   Social Connection and Isolation Panel [NHANES]    Frequency of Communication with Friends and Family: More than three times a week    Frequency of Social Gatherings with Friends and Family: Not on file    Attends Religious Services: Not on file    Active Member of Clubs or Organizations: Not on file    Attends Archivist Meetings: Not on file    Marital Status: Not on file    Tobacco Counseling Counseling given: Not Answered   Clinical Intake:  Pre-visit preparation completed: Yes        Diabetes: No  How often do you need to have someone help you when you read instructions, pamphlets, or other written materials from your doctor or pharmacy?: 1 - Never    Interpreter Needed?: No      Activities of Daily Living    01/11/2022    9:22 AM  In your present state of health, do you have any difficulty performing the following activities:  Hearing? 0  Vision? 0  Difficulty concentrating or making decisions? 0  Walking or climbing stairs? 0  Dressing or bathing? 0  Doing errands, shopping? 0  Preparing Food and eating ? N  Using the Toilet? N  In the past six months, have you accidently leaked urine? N  Do you have problems with loss of bowel control? N  Managing your Medications? N  Managing your Finances? N  Housekeeping or managing your Housekeeping? N    Patient Care Team: Crecencio Mc, MD as PCP - General (Internal Medicine)  Indicate any recent Medical Services you may have received from other than [facility] providers in the past year (date may be approximate).      Assessment:   This is a routine wellness examination for Carolyn Bass.  Virtual Visit via Telephone Note  I connected with  [name] on 01/11/22 at  9:15 AM EDT by telephone and verified that I am speaking with the correct person using two identifiers.  Location: Patient: home Provider: office Persons participating in the virtual visit: patient/Nurse Health Advisor   I discussed the limitations of performing an evaluation and management service by telehealth. We continued and completed visit with audio only. Some vital signs may be absent or patient reported.   Hearing/Vision screen Hearing Screening - Comments:: Reports some difficulty hearing in  crowds and plans to follow up with [facility]. No hearing aids worn.  Vision Screening - Comments:: Followed by Dr. Matilde Sprang Wears corrective lenses They have seen their ophthalmologist in the last 12 months.    Dietary issues and exercise activities discussed: Current Exercise Habits: Home exercise routine, Type of exercise: strength training/weights, Intensity: Intense Vegetarian diet Good water intake   Goals Addressed             This Visit's Progress    Maintain Healthy Lifestyle       Stay active Healthy diet       Depression Screen    01/11/2022    9:22 AM 12/28/2020    3:45 PM 10/26/2020    1:03 PM  PHQ 2/9 Scores  PHQ - 2 Score 0 0 0    Fall Risk    01/11/2022    9:22 AM 12/28/2020    3:45 PM 10/26/2020    1:03 PM  [place] in the past year? 0 0 0  Number falls in past yr: 0    Injury with Fall? 0    Risk for fall due to :  No Fall Risks   Follow up Falls evaluation completed Falls evaluation completed Falls evaluation completed   ASSISTIVE DEVICES UTILIZED TO PREVENT FALLS: Life alert? No  Use of a cane, walker or w/c? No  Grab bars in the bathroom? No  Shower chair or bench in shower? No  Elevated toilet seat or a handicapped toilet? No   TIMED UP AND GO: Was the test performed? No .    Cognitive Function: Patient is alert and oriented x3.  Patient is 100% independent.  Denies difficulty focusing, making decisions, memory loss.       01/11/2022    9:42 AM  6CIT Screen  What Year? 0 points  What month? 0 points  What time? 0 points    Immunizations Immunization History  Administered Date(s) Administered   Moderna Sars-Covid-2 Vaccination 05/23/2020   PFIZER(Purple Top)SARS-COV-2 Vaccination 09/07/2019, 09/28/2019   Td 06/17/2019   Tdap 05/22/2009   Zoster Recombinat (Shingrix) 12/31/2019, 03/14/2020   Flu vaccine- discontinued per patient preference.   Pneumococcal vaccine status: Due, Education has been provided regarding the importance of this vaccine. Advised may receive this vaccine at local pharmacy or Health Dept. Aware to provide a copy of the vaccination record if obtained from local pharmacy or Health Dept. Verbalized acceptance and understanding.  Screening Tests Health Maintenance  Topic Date Due   COVID-19 Vaccine (4 - Mixed Product risk series) 01/27/2022 (Originally 07/18/2020)   Pneumonia Vaccine 36+ Years old (1 - PCV) 02/18/2022 (Originally 01/18/2021)   DEXA SCAN  02/18/2022 (Originally 01/18/2021)   MAMMOGRAM  02/11/2023   PAP SMEAR-Modifier  12/29/2023   COLONOSCOPY (Pts 45-14yr Insurance coverage will need to be confirmed)  02/27/2028   TETANUS/TDAP  06/16/2029   Hepatitis C Screening  Completed   HIV Screening  Completed   Zoster Vaccines- Shingrix  Completed   HPV VACCINES  Aged Out   INFLUENZA VACCINE  Discontinued   Health Maintenance There are no preventive care reminders to display for this patient.  Dexa Scan- deferred per patient preference.   Lung Cancer Screening: (Low Dose CT Chest recommended if Age 66-80years, 30 pack-year currently smoking OR have quit w/in 15years.) does not qualify.   Vision Screening: Recommended annual ophthalmology exams for early detection of glaucoma and other disorders of the  eye.  Dental Screening: Recommended annual  dental exams for proper oral hygiene  Community Resource Referral / Chronic Care Management: CRR required this visit?  No   CCM required this visit?  No      Plan:     I have personally reviewed and noted the following in the patient's chart:   Medical and social history Use of alcohol, tobacco or illicit drugs  Current medications and supplements including opioid prescriptions. Patient is not currently taking opioid prescriptions. Functional ability and status Nutritional status Physical activity Advanced directives List of other physicians Hospitalizations, surgeries, and ER visits in previous 12 months Vitals Screenings to include cognitive, depression, and falls Referrals and appointments  In addition, I have reviewed and discussed with patient certain preventive protocols, quality metrics, and best practice recommendations. A written personalized care plan for preventive services as well as general preventive health recommendations were provided to patient.     OBrien-Blaney, Breana Litts L, LPN   [id number]      I have reviewed the above information and agree with above.   Deborra Medina, MD

## 2022-01-23 ENCOUNTER — Encounter: Payer: Self-pay | Admitting: Internal Medicine

## 2022-02-07 ENCOUNTER — Other Ambulatory Visit: Payer: Self-pay | Admitting: Internal Medicine

## 2022-03-22 ENCOUNTER — Encounter: Payer: Self-pay | Admitting: Internal Medicine

## 2022-03-22 ENCOUNTER — Other Ambulatory Visit: Payer: Self-pay | Admitting: Internal Medicine

## 2022-03-22 ENCOUNTER — Ambulatory Visit (INDEPENDENT_AMBULATORY_CARE_PROVIDER_SITE_OTHER): Payer: Medicare Other | Admitting: Internal Medicine

## 2022-03-22 VITALS — BP 142/68 | HR 109 | Temp 97.6°F | Ht 68.0 in | Wt 171.4 lb

## 2022-03-22 DIAGNOSIS — R1902 Left upper quadrant abdominal swelling, mass and lump: Secondary | ICD-10-CM

## 2022-03-22 DIAGNOSIS — E538 Deficiency of other specified B group vitamins: Secondary | ICD-10-CM

## 2022-03-22 DIAGNOSIS — Z789 Other specified health status: Secondary | ICD-10-CM

## 2022-03-22 DIAGNOSIS — E781 Pure hyperglyceridemia: Secondary | ICD-10-CM

## 2022-03-22 DIAGNOSIS — Z780 Asymptomatic menopausal state: Secondary | ICD-10-CM

## 2022-03-22 DIAGNOSIS — E038 Other specified hypothyroidism: Secondary | ICD-10-CM

## 2022-03-22 DIAGNOSIS — E2839 Other primary ovarian failure: Secondary | ICD-10-CM

## 2022-03-22 DIAGNOSIS — D696 Thrombocytopenia, unspecified: Secondary | ICD-10-CM

## 2022-03-22 DIAGNOSIS — R030 Elevated blood-pressure reading, without diagnosis of hypertension: Secondary | ICD-10-CM

## 2022-03-22 DIAGNOSIS — K909 Intestinal malabsorption, unspecified: Secondary | ICD-10-CM

## 2022-03-22 DIAGNOSIS — Z85038 Personal history of other malignant neoplasm of large intestine: Secondary | ICD-10-CM | POA: Diagnosis not present

## 2022-03-22 NOTE — Assessment & Plan Note (Signed)
Chronicity unclear as it has been painless and patient only noticed it when she had access to a full length mirror.  Lipoma vs desmoid tumor?  Will refer to Gen Surg for excision

## 2022-03-22 NOTE — Assessment & Plan Note (Signed)
Continue b12 and vitamin d supplementation

## 2022-03-22 NOTE — Addendum Note (Signed)
Addended by: Crecencio Mc on: 03/22/2022 02:08 PM   Modules accepted: Orders

## 2022-03-22 NOTE — Assessment & Plan Note (Signed)
Thyroid function is WNL on current dose of armour thyroid.   No current changes needed.   Lab Results  Component Value Date   TSH 2.03 07/01/2020

## 2022-03-22 NOTE — Progress Notes (Addendum)
Subjective:  Patient ID: Carolyn Bass, female    DOB: Nov 05, 1955  Age: 66 y.o. MRN: [id number]  CC: The primary encounter diagnosis was History of colon cancer, stage III. Diagnoses of Estrogen deficiency, Asymptomatic postmenopausal estrogen deficiency, Vitamin B12 deficiency due to intestinal malabsorption, Thrombocytopenia (HCC), Hypertriglyceridemia without hypercholesterolemia, Abdominal wall mass of left upper quadrant, Vegetarian diet, Other specified hypothyroidism, and Elevated blood pressure reading in office without diagnosis of hypertension were also pertinent to this visit.   HPI Carolyn Bass presents for  Chief Complaint  Patient presents with   Acute Visit    Lump on side    66 yr old female presents with relatively painless subcutaneous lump  on  the left anterolateral surface of her abdominal wall  she first noticed a few weeks ago while vacationing in the [place]. In a home with a full length 3 way mirror. She denies any known history of trauma,  although she is physically active , works out at [employer], swims and [place]  regularly.  There is no history of lipo suction or cold lase sculpting.  No bruising or redness.  The lump does become uncomfortable with twisting and bending of the torso but does not restrict movement. She denies nausea and abdominal pain,  she has had an unintentional weight gain of 5-10 lbs over the past year.   She has a history of a colonic resection in 1994 for stage III colon CA,  and abdominal hernia repair in 2002. Last colonoscopy was normal in 2019.   Outpatient Medications Prior to Visit  Medication Sig Dispense Refill   diphenoxylate-atropine (LOMOTIL) 2.5-0.025 MG tablet Take 1 tablet by mouth 4 (four) times daily as needed for diarrhea or loose stools. 60 tablet 5   Multiple Vitamins-Minerals (MULTIVITAMIN ADULTS 50+ PO) Take 1 tablet by mouth daily.     naproxen (NAPROSYN) 500 MG tablet Take 1 tablet by mouth twice daily with meals. 180  tablet 0   thyroid (ARMOUR THYROID) 30 MG tablet Take 1 tablet by mouth daily before breakfast. 90 tablet 0   No facility-administered medications prior to visit.    Review of Systems;  Patient denies headache, fevers, malaise, unintentional weight loss, skin rash, eye pain, sinus congestion and sinus pain, sore throat, dysphagia,  hemoptysis , cough, dyspnea, wheezing, chest pain, palpitations, orthopnea, edema, abdominal pain, nausea, melena, diarrhea, constipation, flank pain, dysuria, hematuria, urinary  Frequency, nocturia, numbness, tingling, seizures,  Focal weakness, Loss of consciousness,  Tremor, insomnia, depression, anxiety, and suicidal ideation.      Objective:  BP (!) 142/68 (BP Location: Left Arm, Patient Position: Sitting, Cuff Size: Normal)   Pulse (!) 109   Temp 97.6 F (36.4 C) (Oral)   Ht '5\' 8"'$  (1.727 m)   Wt 171 lb 6.4 oz (77.7 kg)   SpO2 98%   BMI 26.06 kg/m   BP Readings from Last 3 Encounters:  03/22/22 (!) 142/68  12/28/20 122/86  11/11/20 (!) 146/97    Wt Readings from Last 3 Encounters:  03/22/22 171 lb 6.4 oz (77.7 kg)  01/11/22 167 lb (75.8 kg)  12/28/20 167 lb 6.4 oz (75.9 kg)    General appearance: alert, cooperative and appears stated age Ears: normal TM's and external ear canals both ears Throat: lips, mucosa, and tongue normal; teeth and gums normal Neck: no adenopathy, no carotid bruit, supple, symmetrical, trachea midline and thyroid not enlarged, symmetric, no tenderness/mass/nodules Back: symmetric, no curvature. ROM normal. No CVA tenderness. Lungs: clear  to auscultation bilaterally Heart: regular rate and rhythm, S1, S2 normal, no murmur, click, rub or gallop Abdomen: soft, non-tender; bowel sounds normal; no masses,  no organomegaly Pulses: 2+ and symmetric Skin: Skin color, texture, turgor normal. No rashes or lesions Lymph nodes: Cervical, supraclavicular, and axillary nodes normal. Neuro:  awake and interactive with normal  mood and affect. Higher cortical functions are normal. Speech is clear without word-finding difficulty or dysarthria. Extraocular movements are intact. Visual fields of both eyes are grossly intact. Sensation to light touch is grossly intact bilaterally of upper and lower extremities. Motor examination shows 4+/5 symmetric hand grip and upper extremity and 5/5 lower extremity strength. There is no pronation or drift. Gait is non-ataxic   No results found for: "HGBA1C"  Lab Results  Component Value Date   CREATININE 0.75 07/01/2020   CREATININE 0.70 06/17/2019   CREATININE 0.76 10/10/2017    Lab Results  Component Value Date   WBC 4.0 06/17/2019   HGB 13.5 06/17/2019   HCT 41.4 06/17/2019   PLT 163.0 06/17/2019   GLUCOSE 83 07/01/2020   CHOL 179 07/01/2020   TRIG 138.0 07/01/2020   HDL 63.80 07/01/2020   LDLDIRECT 77.0 06/20/2016   LDLCALC 88 07/01/2020   ALT 21 07/01/2020   AST 20 07/01/2020   NA 140 07/01/2020   K 3.8 07/01/2020   CL 102 07/01/2020   CREATININE 0.75 07/01/2020   BUN 11 07/01/2020   CO2 30 07/01/2020   TSH 2.03 07/01/2020    MM 3D SCREEN BREAST BILATERAL  Result Date: 02/16/2021 CLINICAL DATA:  Screening. EXAM: DIGITAL SCREENING BILATERAL MAMMOGRAM WITH TOMOSYNTHESIS AND CAD TECHNIQUE: Bilateral screening digital craniocaudal and mediolateral oblique mammograms were obtained. Bilateral screening digital breast tomosynthesis was performed. The images were evaluated with computer-aided detection. COMPARISON:  Previous exam(s). ACR Breast Density Category b: There are scattered areas of fibroglandular density. FINDINGS: There are no findings suspicious for malignancy. IMPRESSION: No mammographic evidence of malignancy. A result letter of this screening mammogram will be mailed directly to the patient. RECOMMENDATION: Screening mammogram in one year. (Code:SM-B-01Y) BI-RADS CATEGORY  1: Negative. Electronically Signed   By: Dorise Bullion III M.D.   On: 02/16/2021  11:47    Assessment & Plan:   Problem List Items Addressed This Visit     Other specified hypothyroidism    Thyroid function is WNL on current dose of armour thyroid.   No current changes needed.   Lab Results  Component Value Date   TSH 2.03 07/01/2020        Vegetarian diet    Continue b12 and vitamin d supplementation       Hypertriglyceridemia without hypercholesterolemia    Lipids have been controlled with dietary management,  she is due for follow up  Lab Results  Component Value Date   CHOL 179 07/01/2020   HDL 63.80 07/01/2020   LDLCALC 88 07/01/2020   LDLDIRECT 77.0 06/20/2016   TRIG 138.0 07/01/2020   CHOLHDL 3 07/01/2020        Relevant Orders   Comprehensive metabolic panel   Lipid Panel w/reflex Direct LDL   Vitamin B12 deficiency due to intestinal malabsorption   Relevant Orders   Vitamin B12   History of colon cancer, stage III - Primary    Last colonoscopy was normal in 2019.  5 yr follow up advised by Dr Bary Castilla       Abdominal wall mass of left upper quadrant    Chronicity unclear as it has been  painless and patient only noticed it when she had access to a full length mirror.  Lipoma vs desmoid tumor?  Will refer to Gen Surg for excision       Relevant Orders   Ambulatory referral to General Surgery   Elevated blood pressure reading in office without diagnosis of hypertension    Home readings have been 124/78 or lower.  No changes today       RESOLVED: Thrombocytopenia (HCC)   Relevant Orders   CBC with Differential/Platelet   Other Visit Diagnoses     Estrogen deficiency       Asymptomatic postmenopausal estrogen deficiency       Relevant Orders   DG Bone Density       I spent a total of  30 minutes with this patient in a face to face visit on the date of this encounter reviewing the last office colonoscopy report ,  patient's diet and exercise habits, home blood pressure  readings,  and post visit ordering of testing and  therapeutics.    Follow-up: No follow-ups on file.   Crecencio Mc, MD

## 2022-03-22 NOTE — Assessment & Plan Note (Signed)
Home readings have been 124/78 or lower.  No changes today

## 2022-03-22 NOTE — Assessment & Plan Note (Signed)
Last colonoscopy was normal in 2019.  5 yr follow up advised by Dr Bary Castilla

## 2022-03-22 NOTE — Assessment & Plan Note (Signed)
Lipids have been controlled with dietary management,  she is due for follow up  Lab Results  Component Value Date   CHOL 179 07/01/2020   HDL 63.80 07/01/2020   LDLCALC 88 07/01/2020   LDLDIRECT 77.0 06/20/2016   TRIG 138.0 07/01/2020   CHOLHDL 3 07/01/2020

## 2022-03-26 ENCOUNTER — Other Ambulatory Visit (HOSPITAL_COMMUNITY): Payer: Self-pay

## 2022-03-26 ENCOUNTER — Telehealth: Payer: Self-pay

## 2022-03-26 NOTE — Telephone Encounter (Signed)
Pharmacy Patient Advocate Encounter   Received notification from [facility] that prior authorization for Armour Thyroid '30MG'$  tablets is required/requested.  Per Test Claim: NON-FORMULARY DRUG, TRY LEVOTHYROXINE OR other covered alternatives   PA submitted on 03/26/2022 to [facility] via CoverMyMeds Key [id number] Status is pending

## 2022-03-26 NOTE — Telephone Encounter (Signed)
Patient Advocate Encounter  Prior Authorization for Armour Thyroid '30MG'$  tablets has been approved.    PA# [id number] Key: [id number]  Effective dates: 05/21/2021 through 05/20/2022  Approval letter attached to chart

## 2022-03-29 ENCOUNTER — Other Ambulatory Visit (INDEPENDENT_AMBULATORY_CARE_PROVIDER_SITE_OTHER): Payer: Medicare Other

## 2022-03-29 DIAGNOSIS — E538 Deficiency of other specified B group vitamins: Secondary | ICD-10-CM | POA: Diagnosis not present

## 2022-03-29 DIAGNOSIS — D696 Thrombocytopenia, unspecified: Secondary | ICD-10-CM | POA: Diagnosis not present

## 2022-03-29 DIAGNOSIS — E781 Pure hyperglyceridemia: Secondary | ICD-10-CM

## 2022-03-29 DIAGNOSIS — K909 Intestinal malabsorption, unspecified: Secondary | ICD-10-CM

## 2022-03-30 LAB — LIPID PANEL W/REFLEX DIRECT LDL
Cholesterol: 166 mg/dL (ref <200)
HDL: 54 mg/dL (ref >=50)
LDL Cholesterol (Calc): 82 mg/dL (calc)
Non-HDL Cholesterol (Calc): 112 mg/dL (calc) (ref <130)
Total CHOL/HDL Ratio: 3.1 (calc) (ref <5.0)
Triglycerides: 200 mg/dL — ABNORMAL HIGH (ref <150)

## 2022-03-30 LAB — CBC WITH DIFFERENTIAL/PLATELET
Basophils Absolute: 0 10*3/uL (ref 0.0–0.1)
Basophils Relative: 0.8 % (ref 0.0–3.0)
Eosinophils Absolute: 0 10*3/uL (ref 0.0–0.7)
Eosinophils Relative: 0.6 % (ref 0.0–5.0)
HCT: 40.1 % (ref 36.0–46.0)
Hemoglobin: 13 g/dL (ref 12.0–15.0)
Lymphocytes Relative: 28.9 % (ref 12.0–46.0)
Lymphs Abs: 1.7 10*3/uL (ref 0.7–4.0)
MCHC: 32.5 g/dL (ref 30.0–36.0)
MCV: 87.5 fl (ref 78.0–100.0)
Monocytes Absolute: 0.3 10*3/uL (ref 0.1–1.0)
Monocytes Relative: 5.9 % (ref 3.0–12.0)
Neutro Abs: 3.7 10*3/uL (ref 1.4–7.7)
Neutrophils Relative %: 63.8 % (ref 43.0–77.0)
Platelets: 151 10*3/uL (ref 150.0–400.0)
RBC: 4.58 Mil/uL (ref 3.87–5.11)
RDW: 13.4 % (ref 11.5–15.5)
WBC: 5.8 10*3/uL (ref 4.0–10.5)

## 2022-03-30 LAB — COMPREHENSIVE METABOLIC PANEL
ALT: 16 U/L (ref 0–35)
AST: 16 U/L (ref 0–37)
Albumin: 4.3 g/dL (ref 3.5–5.2)
Alkaline Phosphatase: 54 U/L (ref 39–117)
BUN: 12 mg/dL (ref 6–23)
CO2: 30 mEq/L (ref 19–32)
Calcium: 8.9 mg/dL (ref 8.4–10.5)
Chloride: 102 mEq/L (ref 96–112)
Creatinine, Ser: 0.8 mg/dL (ref 0.40–1.20)
GFR: 76.9 mL/min (ref >60.00)
Glucose, Bld: 75 mg/dL (ref 70–99)
Potassium: 3.7 mEq/L (ref 3.5–5.1)
Sodium: 140 mEq/L (ref 135–145)
Total Bilirubin: 0.7 mg/dL (ref 0.2–1.2)
Total Protein: 6.4 g/dL (ref 6.0–8.3)

## 2022-03-30 LAB — VITAMIN B12: Vitamin B-12: 1474 pg/mL — ABNORMAL HIGH (ref 211–911)

## 2022-04-06 ENCOUNTER — Ambulatory Visit (INDEPENDENT_AMBULATORY_CARE_PROVIDER_SITE_OTHER): Payer: Medicare Other | Admitting: Internal Medicine

## 2022-04-06 ENCOUNTER — Encounter: Payer: Self-pay | Admitting: Internal Medicine

## 2022-04-06 VITALS — BP 128/84 | HR 97 | Temp 98.1°F | Ht 68.0 in | Wt 174.2 lb

## 2022-04-06 DIAGNOSIS — R1902 Left upper quadrant abdominal swelling, mass and lump: Secondary | ICD-10-CM | POA: Diagnosis not present

## 2022-04-06 DIAGNOSIS — Z85038 Personal history of other malignant neoplasm of large intestine: Secondary | ICD-10-CM

## 2022-04-06 DIAGNOSIS — E039 Hypothyroidism, unspecified: Secondary | ICD-10-CM

## 2022-04-06 DIAGNOSIS — K909 Intestinal malabsorption, unspecified: Secondary | ICD-10-CM

## 2022-04-06 DIAGNOSIS — E538 Deficiency of other specified B group vitamins: Secondary | ICD-10-CM

## 2022-04-06 DIAGNOSIS — E038 Other specified hypothyroidism: Secondary | ICD-10-CM

## 2022-04-06 MED ORDER — MOLNUPIRAVIR EUA 200MG CAPSULE: 4.0000 | ORAL_CAPSULE | Freq: Two times a day (BID) | ORAL | 0 refills | Status: AC

## 2022-04-06 NOTE — Progress Notes (Unsigned)
Patient ID: Carolyn Bass, female    DOB: 02-01-1956  Age: 66 y.o. MRN: [id number]  The patient is here for FOLLOW UP AND  management of other chronic and acute problems.   The risk factors are reflected in the social history.  The roster of all physicians providing medical care to patient - is listed in the Snapshot section of the chart.  Activities of daily living:  The patient is 100% independent in all ADLs: dressing, toileting, feeding as well as independent mobility  Home safety : The patient has smoke detectors in the home. They wear seatbelts.  There are no firearms at home. There is no violence in the home.   There is no risks for hepatitis, STDs or HIV. There is no   history of blood transfusion. They have no travel history to infectious disease endemic areas of the world.  The patient has seen their dentist in the last six month. They have seen their eye doctor in the last year. They admit to slight hearing difficulty with regard to whispered voices and some television programs.  They have deferred audiologic testing in the last year.  They do not  have excessive sun exposure. Discussed the need for sun protection: hats, long sleeves and use of sunscreen if there is significant sun exposure.   Diet: the importance of a healthy diet is discussed. They do have a healthy diet.  The benefits of regular aerobic exercise were discussed. She walks 4 times per week ,  20 minutes.   Depression screen: there are no signs or vegative symptoms of depression- irritability, change in appetite, anhedonia, sadness/tearfullness.  Cognitive assessment: the patient manages all their financial and personal affairs and is actively engaged. They could relate day,date,year and events; recalled 2/3 objects at 3 minutes; performed clock-face test normally.  The following portions of the patient's history were reviewed and updated as appropriate: allergies, current medications, past family history, past  medical history,  past surgical history, past social history  and problem list.  Visual acuity was not assessed per patient preference since she has regular follow up with her ophthalmologist. Hearing and body mass index were assessed and reviewed.   During the course of the visit the patient was educated and counseled about appropriate screening and preventive services including : fall prevention , diabetes screening, nutrition counseling, colorectal cancer screening, and recommended immunizations.    CC: There were no encounter diagnoses.  1) ABDOMINAL WALL LIPOMA:  2) COLON CA SCREENING  History Omya has a past medical history of Allergy, Colon cancer ([place]) (1997), Colon polyp, DVT (deep venous thrombosis) ([place]), Encounter for preventive health examination (12/30/2014), Eustachian tube dysfunction (07/03/12), Hypertriglyceridemia without hypercholesterolemia, Hypothyroidism, Prediabetes (02/29/12), and Thyroid nodule (02/29/12).   She has a past surgical history that includes Tubal ligation (2001); Knee arthroscopy w/ ACL reconstruction (Left, 1990); Colon resection (Right, 1997); Tonsillectomy and adenoidectomy (1962); Hernia repair (2002); and Colonoscopy with propofol (N/A, 02/26/2018).   Her family history includes Diabetes in her son; Hyperlipidemia in her brother.She reports that she has never smoked. She has never used smokeless tobacco. She reports current alcohol use. She reports that she does not use drugs.  Outpatient Medications Prior to Visit  Medication Sig Dispense Refill   diphenoxylate-atropine (LOMOTIL) 2.5-0.025 MG tablet Take 1 tablet by mouth 4 (four) times daily as needed for diarrhea or loose stools. 60 tablet 5   Multiple Vitamins-Minerals (MULTIVITAMIN ADULTS 50+ PO) Take 1 tablet by mouth daily.  naproxen (NAPROSYN) 500 MG tablet Take 1 tablet by mouth twice daily with meals. 180 tablet 0   thyroid (ARMOUR THYROID) 30 MG tablet Take 1 tablet by mouth daily before  breakfast. 90 tablet 0   No facility-administered medications prior to visit.    Review of Systems  Objective:  BP 128/84 (BP Location: Left Arm, Patient Position: Sitting, Cuff Size: Normal)   Pulse 97   Temp 98.1 F (36.7 C) (Oral)   Ht '5\' 8"'$  (1.727 m)   Wt 174 lb 3.2 oz (79 kg)   SpO2 97%   BMI 26.49 kg/m   Physical Exam  Physical Exam   Assessment & Plan:   Problem List Items Addressed This Visit   None   I am having Cannon K. Percell Miller maintain her Multiple Vitamins-Minerals (MULTIVITAMIN ADULTS 50+ PO), diphenoxylate-atropine, naproxen, and thyroid.    I provided 40 minutes of  face-to-face time during this encounter reviewing patient's current problems and past surgeries,  recent labs and imaging studies, providing counseling on the above mentioned problems , and coordination  of care .   Follow-up: No follow-ups on file.   Crecencio Mc, MD

## 2022-04-07 LAB — TSH: TSH: 1.48 mIU/L (ref 0.40–4.50)

## 2022-04-08 ENCOUNTER — Encounter: Payer: Self-pay | Admitting: Internal Medicine

## 2022-04-08 NOTE — Assessment & Plan Note (Signed)
Secondary to vegetarian diet. Continue b12 supplementation  Lab Results  Component Value Date   VITAMINB12 1,474 (H) 03/29/2022

## 2022-04-08 NOTE — Assessment & Plan Note (Signed)
Managed with armour thyroid due to history of intolerance to levothyroxine.  TSH is therapeutic .  No changes to dose   Lab Results  Component Value Date   TSH 1.48 04/06/2022

## 2022-04-08 NOTE — Assessment & Plan Note (Signed)
Repeat screening colonoscopy will be due in 2024

## 2022-04-08 NOTE — Assessment & Plan Note (Signed)
She has been seen by surgery and is scheduled for surgical excision of the mass presumed to be a lipoma

## 2022-04-17 ENCOUNTER — Other Ambulatory Visit: Payer: Self-pay | Admitting: General Surgery

## 2022-04-19 LAB — SURGICAL PATHOLOGY

## 2022-04-20 HISTORY — PX: OTHER SURGICAL HISTORY: SHX169

## 2022-04-26 ENCOUNTER — Other Ambulatory Visit: Payer: Self-pay | Admitting: Internal Medicine

## 2022-04-27 NOTE — Telephone Encounter (Signed)
Refilled: 02/09/2022 Last OV: 04/06/2022 Next OV: 01/13/2022

## 2022-05-10 ENCOUNTER — Ambulatory Visit
Admission: RE | Admit: 2022-05-10 | Discharge: 2022-05-10 | Disposition: A | Discharge status: Home / Self Care | Payer: Medicare Other | Source: Ambulatory Visit | Attending: Internal Medicine | Admitting: Internal Medicine

## 2022-05-10 DIAGNOSIS — Z780 Asymptomatic menopausal state: Secondary | ICD-10-CM

## 2022-06-03 ENCOUNTER — Other Ambulatory Visit: Payer: Self-pay | Admitting: Internal Medicine

## 2022-08-16 ENCOUNTER — Other Ambulatory Visit: Payer: Self-pay | Admitting: Internal Medicine

## 2022-12-19 ENCOUNTER — Encounter (INDEPENDENT_AMBULATORY_CARE_PROVIDER_SITE_OTHER): Payer: Self-pay

## 2022-12-20 ENCOUNTER — Encounter: Payer: Medicare Other | Admitting: Dermatology

## 2023-01-10 ENCOUNTER — Encounter: Payer: Self-pay | Admitting: Internal Medicine

## 2023-01-10 DIAGNOSIS — Z7184 Encounter for health counseling related to travel: Secondary | ICD-10-CM

## 2023-01-11 ENCOUNTER — Telehealth: Payer: Self-pay | Admitting: Internal Medicine

## 2023-01-11 NOTE — Telephone Encounter (Signed)
Lft pt vm to call ofc regarding referral. Pt can call [phone] for traveling out of the country.

## 2023-01-14 ENCOUNTER — Ambulatory Visit (INDEPENDENT_AMBULATORY_CARE_PROVIDER_SITE_OTHER): Payer: Medicare Other | Admitting: *Deleted

## 2023-01-14 VITALS — Ht 68.0 in | Wt 170.0 lb

## 2023-01-14 DIAGNOSIS — Z0000 Encounter for general adult medical examination without abnormal findings: Secondary | ICD-10-CM | POA: Diagnosis not present

## 2023-01-14 NOTE — Patient Instructions (Signed)
Carolyn Bass , Thank you for taking time to come for your Medicare Wellness Visit. I appreciate your ongoing commitment to your health goals. Please review the following plan we discussed and let me know if I can assist you in the future.   Referrals/Orders/Follow-Ups/Clinician Recommendations: None  This is a list of the screening recommended for you and due dates:  Health Maintenance  Topic Date Due   COVID-19 Vaccine (4 - 2023-24 season) 01/19/2022   Flu Shot  12/20/2022   Colon Cancer Screening  02/27/2023   Pneumonia Vaccine (1 of 1 - PCV) 03/23/2023*   Mammogram  02/11/2023   Medicare Annual Wellness Visit  01/14/2024   DTaP/Tdap/Td vaccine (3 - Td or Tdap) 06/16/2029   DEXA scan (bone density measurement)  Completed   Hepatitis C Screening  Completed   Zoster (Shingles) Vaccine  Completed   HPV Vaccine  Aged Out  *Topic was postponed. The date shown is not the original due date.    Advanced directives: (In Chart) A copy of your advanced directives are scanned into your chart should your provider ever need it.  Next Medicare Annual Wellness Visit scheduled for next year: Yes 01/22/24 @ 12:45

## 2023-01-14 NOTE — Progress Notes (Signed)
Subjective:   Carolyn Bass is a 67 y.o. female who presents for Medicare Annual (Subsequent) preventive examination.  Visit Complete: Virtual  I connected with  Carolyn Bass on 01/14/23 by a audio enabled telemedicine application and verified that I am speaking with the correct person using two identifiers.  Patient Location: Home  Provider Location: Office/Clinic  I discussed the limitations of evaluation and management by telemedicine. The patient expressed understanding and agreed to proceed.  Patient Medicare AWV questionnaire was completed by the patient on 01/09/23; I have confirmed that all information answered by patient is correct and no changes since this date.  Vital Signs: Unable to obtain new vitals due to this being a telehealth visit.   Review of Systems     Cardiac Risk Factors include: advanced age (>52men, >29 women)     Objective:    Today's Vitals   01/14/23 0902  Weight: 170 lb (77.1 kg)  Height: 5\' 8"  (1.727 m)   Body mass index is 25.85 kg/m.     01/14/2023    9:21 AM 01/11/2022    9:21 AM 02/26/2018    7:12 AM  Advanced Directives  Does Patient Have a Medical Advance Directive? Yes Yes Yes  Type of Estate agent of [place];Living will Healthcare Power of [place];Living will   Does patient want to make changes to medical advance directive? No - Patient declined No - Patient declined   Copy of Healthcare Power of Attorney in Chart? Yes - validated most recent copy scanned in chart (See row information) No - copy requested     Current Medications (verified) Outpatient Encounter Medications as of 01/14/2023  Medication Sig   cyanocobalamin (VITAMIN B12) 1000 MCG/ML injection Inject 1,000 mcg into the muscle once. Take once a year   diphenoxylate-atropine (LOMOTIL) 2.5-0.025 MG tablet Take 1 tablet by mouth 4 (four) times daily as needed for diarrhea or loose stools.   Multiple Vitamins-Minerals (MULTIVITAMIN ADULTS 50+ PO)  Take 1 tablet by mouth daily.   thyroid (ARMOUR THYROID) 30 MG tablet Take 1 tablet by mouth daily before breakfast.   [DISCONTINUED] naproxen (NAPROSYN) 500 MG tablet Take 1 tablet by mouth twice daily with meals.   No facility-administered encounter medications on file as of 01/14/2023.    Allergies (verified) Synthroid [levothyroxine sodium], Penicillins, and Tetracyclines & related   History: Past Medical History:  Diagnosis Date   Allergy    seasonal   Colon cancer (HCC) 1997   s/p resection, chemo   Colon polyp    DVT (deep venous thrombosis) (HCC)    2/2 BCP   Encounter for preventive health examination 12/30/2014   Eustachian tube dysfunction 07/03/12   Hypertriglyceridemia without hypercholesterolemia    Hypothyroidism    On Armour. Did not tolerate synthroid   Prediabetes 02/29/12   Thyroid nodule 02/29/12   Past Surgical History:  Procedure Laterality Date   COLON RESECTION Right 1997   COLONOSCOPY WITH PROPOFOL N/A 02/26/2018   Procedure: COLONOSCOPY WITH PROPOFOL;  Surgeon: Earline Mayotte, MD;  Location: [facility] ENDOSCOPY;  Service: Endoscopy;  Laterality: N/A;   HERNIA REPAIR  2002   KNEE ARTHROSCOPY W/ ACL RECONSTRUCTION Left 1990   lipoma removed from left side  04/2022   TONSILLECTOMY AND ADENOIDECTOMY  1962   TUBAL LIGATION  2001   Family History  Problem Relation Age of Onset   Hyperlipidemia Brother    Diabetes Son    Social History   Socioeconomic History   Marital  status: Single    Spouse name: Not on file   Number of children: 0   Years of education: 31   Highest education level: Not on file  Occupational History   Occupation: Dentist    Comment: Retired  Tobacco Use   Smoking status: Never   Smokeless tobacco: Never  Substance and Sexual Activity   Alcohol use: Yes    Comment: 1 drink weekly   Drug use: No   Sexual activity: Not on file  Other Topics Concern   Not on file  Social History Narrative   Living in [place].  Divorced. Moved from [place] to this area in April 2015. She has an Set designer. Retired from Designer, jewellery.      Hobbies: cross country ski, gardening, travel, reading, sewing   Caffeine: tea 2 cups daily   Exercise: [employer]. Workouts 4x/week - cardio, weight training   Diet: pescetarian   Social Determinants of Health   Financial Resource Strain: Low Risk  (01/09/2023)   Overall Financial Resource Strain (CARDIA)    Difficulty of Paying Living Expenses: Not hard at all  Food Insecurity: No Food Insecurity (01/09/2023)   Hunger Vital Sign    Worried About Running Out of Food in the Last Year: Never true    Ran Out of Food in the Last Year: Never true  Transportation Needs: No Transportation Needs (01/09/2023)   PRAPARE - Administrator, Civil Service (Medical): No    Lack of Transportation (Non-Medical): No  Physical Activity: Sufficiently Active (01/09/2023)   Exercise Vital Sign    Days of Exercise per Week: 5 days    Minutes of Exercise per Session: 60 min  Stress: No Stress Concern Present (01/09/2023)   [employer] of Occupational Health - Occupational Stress Questionnaire    Feeling of Stress : Not at all  Social Connections: Moderately Integrated (01/09/2023)   Social Connection and Isolation Panel [NHANES]    Frequency of Communication with Friends and Family: Twice a week    Frequency of Social Gatherings with Friends and Family: More than three times a week    Attends Religious Services: More than 4 times per year    Active Member of [employer] or Organizations: Yes    Attends Engineer, structural: More than 4 times per year    Marital Status: Divorced    Tobacco Counseling Counseling given: Not Answered   Clinical Intake:  Pre-visit preparation completed: Yes  Pain : No/denies pain     BMI - recorded: 25.85 Nutritional Status: BMI 25 -29 Overweight Nutritional Risks: None Diabetes: No  How often do you need to have someone help  you when you read instructions, pamphlets, or other written materials from your doctor or pharmacy?: 1 - Never  Interpreter Needed?: No  Information entered by :: R. Tytus Strahle LPN   Activities of Daily Living    01/09/2023    4:48 PM  In your present state of health, do you have any difficulty performing the following activities:  Hearing? 0  Vision? 0  Difficulty concentrating or making decisions? 0  Walking or climbing stairs? 0  Dressing or bathing? 0  Doing errands, shopping? 0  Preparing Food and eating ? N  Using the Toilet? N  In the past six months, have you accidently leaked urine? N  Do you have problems with loss of bowel control? N  Managing your Medications? N  Managing your Finances? N  Housekeeping or managing your  Housekeeping? N    Patient Care Team: Sherlene Shams, MD as PCP - General (Internal Medicine)  Indicate any recent Medical Services you may have received from other than [facility] providers in the past year (date may be approximate).     Assessment:   This is a routine wellness examination for Shammara.  Hearing/Vision screen Hearing Screening - Comments:: Wears aids at times Vision Screening - Comments:: Contacts and readers  Dietary issues and exercise activities discussed:     Goals Addressed             This Visit's Progress    Patient Stated       Main weight and exercise       Depression Screen    01/14/2023    9:12 AM 04/06/2022    4:08 PM 03/22/2022    1:02 PM 01/11/2022    9:22 AM 12/28/2020    3:45 PM 10/26/2020    1:03 PM  PHQ 2/9 Scores  PHQ - 2 Score 0 0 0 0 0 0  PHQ- 9 Score 0         Fall Risk    01/09/2023    4:48 PM 04/06/2022    4:08 PM 03/22/2022    1:01 PM 01/11/2022    9:22 AM 12/28/2020    3:45 PM  Fall Risk   Falls in the past year? 0 0 0 0 0  Number falls in past yr: 0   0   Injury with Fall? 0   0   Risk for fall due to :  No Fall Risks No Fall Risks  No Fall Risks  Follow up Falls evaluation completed;Falls  prevention discussed Falls evaluation completed Falls evaluation completed Falls evaluation completed Falls evaluation completed    MEDICARE RISK AT HOME: Medicare Risk at Home Any stairs in or around the home?: No If so, are there any without handrails?: No Home free of loose throw rugs in walkways, pet beds, electrical cords, etc?: Yes Adequate lighting in your home to reduce risk of falls?: Yes Life alert?: No Use of a cane, walker or w/c?: No Grab bars in the bathroom?: No Shower chair or bench in shower?: No Elevated toilet seat or a handicapped toilet?: No   Cognitive Function:        01/14/2023    9:21 AM 01/11/2022    9:42 AM  6CIT Screen  What Year? 0 points 0 points  What month? 0 points 0 points  What time? 0 points 0 points  Count back from 20 0 points   Months in reverse 0 points   Repeat phrase 2 points   Total Score 2 points     Immunizations Immunization History  Administered Date(s) Administered   Moderna Sars-Covid-2 Vaccination 05/23/2020   PFIZER(Purple Top)SARS-COV-2 Vaccination 09/07/2019, 09/28/2019   Td 06/17/2019   Tdap 05/22/2009   Zoster Recombinant(Shingrix) 12/31/2019, 03/14/2020    TDAP status: Up to date  Flu Vaccine status: Declined, Education has been provided regarding the importance of this vaccine but patient still declined. Advised may receive this vaccine at local pharmacy or Health Dept. Aware to provide a copy of the vaccination record if obtained from local pharmacy or Health Dept. Verbalized acceptance and understanding.  Pneumococcal vaccine status: Up to date Will bring form next visit  Covid-19 vaccine status: Completed vaccines  Qualifies for Shingles Vaccine? Yes   Zostavax completed No   Shingrix Completed?: Yes  Screening Tests Health Maintenance  Topic Date Due  COVID-19 Vaccine (4 - 2023-24 season) 01/19/2022   Medicare Annual Wellness (AWV)  01/12/2023   INFLUENZA VACCINE  12/20/2022   Colonoscopy   02/27/2023   Pneumonia Vaccine 55+ Years old (1 of 1 - PCV) 03/23/2023 (Originally 01/18/2021)   MAMMOGRAM  02/11/2023   DTaP/Tdap/Td (3 - Td or Tdap) 06/16/2029   DEXA SCAN  Completed   Hepatitis C Screening  Completed   Zoster Vaccines- Shingrix  Completed   HPV VACCINES  Aged Out    Health Maintenance  Health Maintenance Due  Topic Date Due   COVID-19 Vaccine (4 - 2023-24 season) 01/19/2022   Medicare Annual Wellness (AWV)  01/12/2023   INFLUENZA VACCINE  12/20/2022   Colonoscopy  02/27/2023    Colorectal cancer screening: Type of screening: Colonoscopy. Completed 10/19. Repeat every 5 years Will discuss with PCP at next visit   Mammogram status: Completed 9/22. Repeat every year  patient stated that she agreed to every 2 years  Bone Density status: Completed 12/23. Results reflect: Bone density results: NORMAL. Repeat every 2 years.  Lung Cancer Screening: (Low Dose CT Chest recommended if Age 97-80 years, 20 pack-year currently smoking OR have quit w/in 15years.) does not qualify.    Additional Screening:  Hepatitis C Screening: does qualify; Completed 1/18  Vision Screening: Recommended annual ophthalmology exams for early detection of glaucoma and other disorders of the eye. Is the patient up to date with their annual eye exam?  Yes  Who is the provider or what is the name of the office in which the patient attends annual eye exams? [facility] If pt is not established with a provider, would they like to be referred to a provider to establish care? No .   Dental Screening: Recommended annual dental exams for proper oral hygiene   Community Resource Referral / Chronic Care Management: CRR required this visit?  No   CCM required this visit?  No     Plan:     I have personally reviewed and noted the following in the patient's chart:   Medical and social history Use of alcohol, tobacco or illicit drugs  Current medications and supplements including opioid  prescriptions. Patient is not currently taking opioid prescriptions. Functional ability and status Nutritional status Physical activity Advanced directives List of other physicians Hospitalizations, surgeries, and ER visits in previous 12 months Vitals Screenings to include cognitive, depression, and falls Referrals and appointments  In addition, I have reviewed and discussed with patient certain preventive protocols, quality metrics, and best practice recommendations. A written personalized care plan for preventive services as well as general preventive health recommendations were provided to patient.     Sydell Axon, LPN   1/61/0960   After Visit Summary: (MyChart) Due to this being a telephonic visit, the after visit summary with patients personalized plan was offered to patient via MyChart   Nurse Notes: None

## 2023-01-28 ENCOUNTER — Encounter: Payer: Medicare Other | Admitting: Dermatology

## 2023-03-10 ENCOUNTER — Other Ambulatory Visit: Payer: Self-pay | Admitting: Internal Medicine

## 2023-03-12 MED ORDER — THYROID 30 MG PO TABS: ORAL_TABLET | ORAL | 0 refills | Status: DC

## 2023-06-04 ENCOUNTER — Encounter: Payer: Self-pay | Admitting: Internal Medicine

## 2023-07-02 ENCOUNTER — Telehealth: Payer: Self-pay

## 2023-07-02 ENCOUNTER — Other Ambulatory Visit (HOSPITAL_COMMUNITY): Payer: Self-pay

## 2023-07-02 NOTE — Telephone Encounter (Signed)
Pharmacy Patient Advocate Encounter  Received notification from [facility] that Prior Authorization for Armour Thyroid 30MG  tablets has been approved for further notice.     PA #/Case ID/Reference #: [id number]

## 2023-07-02 NOTE — Telephone Encounter (Signed)
Pharmacy Patient Advocate Encounter   Received notification from CoverMyMeds that prior authorization for Armour Thyroid 30MG  tablets is required/requested.   Insurance verification completed.   The patient is insured through [facility] .   Per test claim: PA required; PA submitted to above mentioned insurance via CoverMyMeds Key/confirmation #/EOC [facility] Status is pending

## 2023-07-08 ENCOUNTER — Encounter: Payer: Self-pay | Admitting: Internal Medicine

## 2023-07-09 ENCOUNTER — Other Ambulatory Visit: Payer: Self-pay | Admitting: Internal Medicine

## 2023-07-09 MED ORDER — TIZANIDINE HCL 2 MG PO TABS: 2.0000 mg | ORAL_TABLET | Freq: Four times a day (QID) | ORAL | 2 refills | Status: AC | PRN

## 2023-07-09 MED ORDER — DIPHENOXYLATE-ATROPINE 2.5-0.025 MG PO TABS: 1.0000 | ORAL_TABLET | Freq: Four times a day (QID) | ORAL | 5 refills | Status: AC | PRN

## 2023-07-14 ENCOUNTER — Other Ambulatory Visit: Payer: Self-pay | Admitting: Internal Medicine

## 2023-07-18 NOTE — Telephone Encounter (Signed)
 Is it okay to refill until she returns from [place]?

## 2023-10-28 ENCOUNTER — Telehealth: Payer: Self-pay | Admitting: Internal Medicine

## 2023-10-28 DIAGNOSIS — E538 Deficiency of other specified B group vitamins: Secondary | ICD-10-CM

## 2023-10-28 DIAGNOSIS — E038 Other specified hypothyroidism: Secondary | ICD-10-CM

## 2023-10-28 DIAGNOSIS — E781 Pure hyperglyceridemia: Secondary | ICD-10-CM

## 2023-10-28 DIAGNOSIS — R030 Elevated blood-pressure reading, without diagnosis of hypertension: Secondary | ICD-10-CM

## 2023-10-28 NOTE — Telephone Encounter (Signed)
 Patient need labs ordered please

## 2023-10-31 ENCOUNTER — Other Ambulatory Visit

## 2023-11-03 ENCOUNTER — Other Ambulatory Visit: Payer: Self-pay | Admitting: Internal Medicine

## 2023-11-04 ENCOUNTER — Other Ambulatory Visit (INDEPENDENT_AMBULATORY_CARE_PROVIDER_SITE_OTHER)

## 2023-11-04 ENCOUNTER — Encounter: Payer: Self-pay | Admitting: Internal Medicine

## 2023-11-04 DIAGNOSIS — E038 Other specified hypothyroidism: Secondary | ICD-10-CM

## 2023-11-04 DIAGNOSIS — E538 Deficiency of other specified B group vitamins: Secondary | ICD-10-CM

## 2023-11-04 DIAGNOSIS — R030 Elevated blood-pressure reading, without diagnosis of hypertension: Secondary | ICD-10-CM | POA: Diagnosis not present

## 2023-11-04 DIAGNOSIS — E781 Pure hyperglyceridemia: Secondary | ICD-10-CM | POA: Diagnosis not present

## 2023-11-04 DIAGNOSIS — K909 Intestinal malabsorption, unspecified: Secondary | ICD-10-CM | POA: Diagnosis not present

## 2023-11-04 LAB — LIPID PANEL
Cholesterol: 151 mg/dL (ref 0–200)
HDL: 45.9 mg/dL (ref >39.00)
LDL Cholesterol: 65 mg/dL (ref 0–99)
NonHDL: 105.05
Total CHOL/HDL Ratio: 3
Triglycerides: 200 mg/dL — ABNORMAL HIGH (ref 0.0–149.0)
VLDL: 40 mg/dL (ref 0.0–40.0)

## 2023-11-04 LAB — COMPREHENSIVE METABOLIC PANEL WITH GFR
ALT: 13 U/L (ref 0–35)
AST: 14 U/L (ref 0–37)
Albumin: 4.5 g/dL (ref 3.5–5.2)
Alkaline Phosphatase: 55 U/L (ref 39–117)
BUN: 13 mg/dL (ref 6–23)
CO2: 26 meq/L (ref 19–32)
Calcium: 9.2 mg/dL (ref 8.4–10.5)
Chloride: 103 meq/L (ref 96–112)
Creatinine, Ser: 0.7 mg/dL (ref 0.40–1.20)
GFR: 89.26 mL/min (ref >60.00)
Glucose, Bld: 92 mg/dL (ref 70–99)
Potassium: 4.1 meq/L (ref 3.5–5.1)
Sodium: 138 meq/L (ref 135–145)
Total Bilirubin: 0.9 mg/dL (ref 0.2–1.2)
Total Protein: 7 g/dL (ref 6.0–8.3)

## 2023-11-04 LAB — CBC WITH DIFFERENTIAL/PLATELET
Basophils Absolute: 0 10*3/uL (ref 0.0–0.1)
Basophils Relative: 0.7 % (ref 0.0–3.0)
Eosinophils Absolute: 0.1 10*3/uL (ref 0.0–0.7)
Eosinophils Relative: 1.5 % (ref 0.0–5.0)
HCT: 41.9 % (ref 36.0–46.0)
Hemoglobin: 13.7 g/dL (ref 12.0–15.0)
Lymphocytes Relative: 27.8 % (ref 12.0–46.0)
Lymphs Abs: 1.3 10*3/uL (ref 0.7–4.0)
MCHC: 32.7 g/dL (ref 30.0–36.0)
MCV: 85.9 fl (ref 78.0–100.0)
Monocytes Absolute: 0.3 10*3/uL (ref 0.1–1.0)
Monocytes Relative: 6.6 % (ref 3.0–12.0)
Neutro Abs: 3 10*3/uL (ref 1.4–7.7)
Neutrophils Relative %: 63.4 % (ref 43.0–77.0)
Platelets: 151 10*3/uL (ref 150.0–400.0)
RBC: 4.87 Mil/uL (ref 3.87–5.11)
RDW: 13.5 % (ref 11.5–15.5)
WBC: 4.7 10*3/uL (ref 4.0–10.5)

## 2023-11-04 NOTE — Addendum Note (Signed)
 Addended by: Thressa Flora D on: 11/04/2023 07:57 AM   Modules accepted: Orders

## 2023-11-04 NOTE — Telephone Encounter (Signed)
 Spoke with pt and scheduled her for an appt on 11/20/2023. Pt had her labs done today. Is it okay to refill thyroid  medication at the current dose?

## 2023-11-05 ENCOUNTER — Ambulatory Visit: Payer: Self-pay | Admitting: Internal Medicine

## 2023-11-05 LAB — TSH: TSH: 2.41 u[IU]/mL (ref 0.450–4.500)

## 2023-11-05 LAB — B12 AND FOLATE PANEL
Folate: >20 ng/mL (ref >3.0)
Vitamin B-12: 1314 pg/mL — ABNORMAL HIGH (ref 232–1245)

## 2023-11-20 ENCOUNTER — Encounter: Payer: Self-pay | Admitting: Internal Medicine

## 2023-11-20 ENCOUNTER — Ambulatory Visit (INDEPENDENT_AMBULATORY_CARE_PROVIDER_SITE_OTHER): Admitting: Internal Medicine

## 2023-11-20 VITALS — BP 107/75 | HR 89 | Ht 68.0 in | Wt 172.6 lb

## 2023-11-20 DIAGNOSIS — R1902 Left upper quadrant abdominal swelling, mass and lump: Secondary | ICD-10-CM

## 2023-11-20 DIAGNOSIS — Z1211 Encounter for screening for malignant neoplasm of colon: Secondary | ICD-10-CM | POA: Diagnosis not present

## 2023-11-20 DIAGNOSIS — Z1231 Encounter for screening mammogram for malignant neoplasm of breast: Secondary | ICD-10-CM

## 2023-11-20 DIAGNOSIS — Z85038 Personal history of other malignant neoplasm of large intestine: Secondary | ICD-10-CM

## 2023-11-20 DIAGNOSIS — E781 Pure hyperglyceridemia: Secondary | ICD-10-CM | POA: Diagnosis not present

## 2023-11-20 DIAGNOSIS — R030 Elevated blood-pressure reading, without diagnosis of hypertension: Secondary | ICD-10-CM

## 2023-11-20 DIAGNOSIS — E038 Other specified hypothyroidism: Secondary | ICD-10-CM | POA: Diagnosis not present

## 2023-11-20 DIAGNOSIS — K909 Intestinal malabsorption, unspecified: Secondary | ICD-10-CM

## 2023-11-20 DIAGNOSIS — E538 Deficiency of other specified B group vitamins: Secondary | ICD-10-CM

## 2023-11-20 MED ORDER — ALPRAZOLAM 0.5 MG PO TABS: 0.5000 mg | ORAL_TABLET | Freq: Two times a day (BID) | ORAL | 0 refills | Status: AC | PRN

## 2023-11-20 NOTE — Patient Instructions (Signed)
YOUR MAMMOGRAM IS DUE, PLEASE CALL AND GET THIS SCHEDULED! [facility]  - call [phone]   

## 2023-11-20 NOTE — Progress Notes (Signed)
 Subjective:  Patient ID: Carolyn Bass, female    DOB: 1956/05/12  Age: 68 y.o. MRN: [id number]  CC: The primary encounter diagnosis was Encounter for screening mammogram for malignant neoplasm of breast. Diagnoses of Colon cancer screening, Other specified hypothyroidism, Hypertriglyceridemia without hypercholesterolemia, Vitamin B12 deficiency due to intestinal malabsorption, History of colon cancer, stage III, Elevated blood pressure reading in office without diagnosis of hypertension, and Abdominal wall mass of left upper quadrant were also pertinent to this visit.   HPI Carolyn Bass presents for  Chief Complaint  Patient presents with   Medical Management of Chronic Issues   1) ELEVATED BLOOD PRESSURE:  HOME READINGS on  a VALIDATED  automatic machine and have been < 120/70  2) VACCINATIONS DONE FOR INDONESIA/BALI  TRIP BY  [facility] .  She received  A POLIO BOOSTER , ATOVOQUONE  FOR MALARIA. , vivotif for  TYPHOID, and AZITHROMYCIN FOR TRAVELERS'S DIARRHEA .  She also has  rx for AMOX AND KEFLEX   3) HM:  mammogram and colonoscopy due    Outpatient Medications Prior to Visit  Medication Sig Dispense Refill   cyanocobalamin  (VITAMIN B12) 1000 MCG/ML injection Inject 1,000 mcg into the muscle once. Take once a year     diphenoxylate -atropine  (LOMOTIL ) 2.5-0.025 MG tablet Take 1 tablet by mouth 4 (four) times daily as needed for diarrhea or loose stools. 60 tablet 5   Multiple Vitamins-Minerals (MULTIVITAMIN ADULTS 50+ PO) Take 1 tablet by mouth daily.     thyroid  (ARMOUR THYROID ) 30 MG tablet Take 1 tablet by mouth daily before breakfast. 90 tablet 1   tiZANidine  (ZANAFLEX ) 2 MG tablet Take 1 tablet (2 mg total) by mouth every 6 (six) hours as needed for muscle spasms. 60 tablet 2   No facility-administered medications prior to visit.    Review of Systems;  Patient denies headache, fevers, malaise, unintentional weight loss, skin rash, eye pain, sinus congestion  and sinus pain, sore throat, dysphagia,  hemoptysis , cough, dyspnea, wheezing, chest pain, palpitations, orthopnea, edema, abdominal pain, nausea, melena, diarrhea, constipation, flank pain, dysuria, hematuria, urinary  Frequency, nocturia, numbness, tingling, seizures,  Focal weakness, Loss of consciousness,  Tremor, insomnia, depression, anxiety, and suicidal ideation.      Objective:  BP 107/75 (Cuff Size: Normal)   Pulse 89   Ht 5' 8 (1.727 m)   Wt 172 lb 9.6 oz (78.3 kg)   SpO2 96%   BMI 26.24 kg/m   BP Readings from Last 3 Encounters:  11/20/23 107/75  04/06/22 128/84  03/22/22 (!) 142/68    Wt Readings from Last 3 Encounters:  11/20/23 172 lb 9.6 oz (78.3 kg)  01/14/23 170 lb (77.1 kg)  04/06/22 174 lb 3.2 oz (79 kg)    Physical Exam  No results found for: HGBA1C  Lab Results  Component Value Date   CREATININE 0.70 11/04/2023   CREATININE 0.80 03/29/2022   CREATININE 0.75 07/01/2020    Lab Results  Component Value Date   WBC 4.7 11/04/2023   HGB 13.7 11/04/2023   HCT 41.9 11/04/2023   PLT 151.0 11/04/2023   GLUCOSE 92 11/04/2023   CHOL 151 11/04/2023   TRIG 200.0 (H) 11/04/2023   HDL 45.90 11/04/2023   LDLDIRECT 77.0 06/20/2016   LDLCALC 65 11/04/2023   ALT 13 11/04/2023   AST 14 11/04/2023   NA 138 11/04/2023   K 4.1 11/04/2023   CL 103 11/04/2023   CREATININE 0.70 11/04/2023   BUN 13  11/04/2023   CO2 26 11/04/2023   TSH 2.410 11/04/2023    DG MOBILE BONE DENSITY Result Date: 05/11/2022 EXAM: DUAL X-RAY ABSORPTIOMETRY (DXA) FOR BONE MINERAL DENSITY 05/10/2022 1:57 pm CLINICAL DATA:  68 year old Female Postmenopausal. post menopausal estrogen deficency TECHNIQUE: An axial (e.g., hips, spine) and/or appendicular (e.g., radius) exam was performed, as appropriate, using Haematologist at [facility]. Images are obtained for bone mineral density measurement and are not obtained for diagnostic purposes. [id number]  Exclusions: Lumbar spine. COMPARISON:  None. FINDINGS: Scan quality: Good. LEFT FEMORAL NECK: BMD (in g/cm2): 0.771 T-score: -0.7 Z-score: 0.9 LEFT TOTAL HIP: BMD (in g/cm2): 0.913 T-score: -0.2 Z-score: 1.1 LEFT FOREARM (RADIUS 33%): BMD (in g/cm2): 0.665 T-score: -0.5 Z-score: 1.3 FRAX 10-YEAR PROBABILITY OF FRACTURE: Patient does not meet criteria for FRAX assessment. IMPRESSION: Normal based on BMD. Fracture risk is not increased. RECOMMENDATIONS: 1. All patients should optimize calcium and vitamin D  intake. 2. Consider FDA-approved medical therapies in postmenopausal women and men aged 55 years and older, based on the following: - A hip or vertebral (clinical or morphometric) fracture - T-score less than or equal to -2.5 and secondary causes have been excluded. - Low bone mass (T-score between -1.0 and -2.5) and a 10-year probability of a hip fracture greater than or equal to 3% or a 10-year probability of a major osteoporosis-related fracture greater than or equal to 20% based on the [place] -adapted WHO algorithm. - Clinician judgment and/or patient preferences may indicate treatment for people with 10-year fracture probabilities above or below these levels 3. Patients with diagnosis of osteoporosis or at high risk for fracture should have regular bone mineral density tests. For patients eligible for Medicare, routine testing is allowed once every 2 years. The testing frequency can be increased to one year for patients who have rapidly progressing disease, those who are receiving or discontinuing medical therapy to restore bone mass, or have additional risk factors. Electronically Signed   By: Toribio Agreste M.D.   On: 05/11/2022 07:58    Assessment & Plan:  .Encounter for screening mammogram for malignant neoplasm of breast -     3D Screening Mammogram, Left and Right; Future  Colon cancer screening -     Ambulatory referral to Gastroenterology  Other specified hypothyroidism Assessment & Plan: Managed  with 30 mg  armour thyroid  due to history of intolerance to levothyroxine.  TSH is therapeutic .  No changes to dose   Lab Results  Component Value Date   TSH 2.410 11/04/2023      Hypertriglyceridemia without hypercholesterolemia Assessment & Plan: Lipids have been controlled with dietary management,  no indication for statins or fenofibrates.  Lab Results  Component Value Date   CHOL 151 11/04/2023   HDL 45.90 11/04/2023   LDLCALC 65 11/04/2023   LDLDIRECT 77.0 06/20/2016   TRIG 200.0 (H) 11/04/2023   CHOLHDL 3 11/04/2023      Vitamin B12 deficiency due to intestinal malabsorption Assessment & Plan: Secondary to vegetarian diet and proximal colonic resection.   Continue b12 supplementation  Lab Results  Component Value Date   VITAMINB12 1,314 (H) 11/04/2023      History of colon cancer, stage III Assessment & Plan: Continue regular screening .  She was due for screening colonoscopy in  2024 but has been lost to follow up.  Referral made    Elevated blood pressure reading in office without diagnosis of hypertension Assessment & Plan: Home readings have been 124/78 or lower.  No changes today    Abdominal wall mass of left upper quadrant Assessment & Plan: She underwent surgical excision of a lipoma  Nov 2023/    Other orders -     ALPRAZolam ; Take 1 tablet (0.5 mg total) by mouth 2 (two) times daily as needed for anxiety.  Dispense: 30 tablet; Refill: 0     I spent 30 minutes on the day of this face to face encounter reviewing patient's  prior relevant surgical and non surgical procedures, recent  labs and imaging studies, counseling on  lipid and thyroid  management,  reviewing the assessment and plan with patient, and post visit ordering and reviewing of  diagnostics and therapeutics with patient  .   Follow-up: No follow-ups on file.   Verneita LITTIE Kettering, MD

## 2023-11-23 NOTE — Assessment & Plan Note (Signed)
 Lipids have been controlled with dietary management,  no indication for statins or fenofibrates.  Lab Results  Component Value Date   CHOL 151 11/04/2023   HDL 45.90 11/04/2023   LDLCALC 65 11/04/2023   LDLDIRECT 77.0 06/20/2016   TRIG 200.0 (H) 11/04/2023   CHOLHDL 3 11/04/2023

## 2023-11-23 NOTE — Assessment & Plan Note (Signed)
Home readings have been 124/78 or lower.  No changes today

## 2023-11-23 NOTE — Assessment & Plan Note (Signed)
 Secondary to vegetarian diet and proximal colonic resection.   Continue b12 supplementation  Lab Results  Component Value Date   VITAMINB12 1,314 (H) 11/04/2023

## 2023-11-23 NOTE — Assessment & Plan Note (Addendum)
 She underwent surgical excision of a lipoma  Nov 2023/

## 2023-11-23 NOTE — Assessment & Plan Note (Addendum)
 Managed with 30 mg  armour thyroid  due to history of intolerance to levothyroxine.  TSH is therapeutic .  No changes to dose   Lab Results  Component Value Date   TSH 2.410 11/04/2023

## 2023-11-23 NOTE — Assessment & Plan Note (Signed)
 Continue regular screening .  She was due for screening colonoscopy in  2024 but has been lost to follow up.  Referral made

## 2023-11-27 ENCOUNTER — Ambulatory Visit

## 2023-11-30 ENCOUNTER — Encounter: Payer: Self-pay | Admitting: Internal Medicine

## 2023-12-05 ENCOUNTER — Ambulatory Visit
Admission: RE | Admit: 2023-12-05 | Discharge: 2023-12-05 | Disposition: A | Discharge status: Home / Self Care | Source: Ambulatory Visit | Attending: Internal Medicine | Admitting: Internal Medicine

## 2023-12-05 DIAGNOSIS — Z1231 Encounter for screening mammogram for malignant neoplasm of breast: Secondary | ICD-10-CM

## 2023-12-10 ENCOUNTER — Ambulatory Visit: Payer: Self-pay | Admitting: Internal Medicine

## 2023-12-26 NOTE — Telephone Encounter (Signed)
 Called [facility] and they do have the referral. Then called the pt and let her know that [facility] does have the referral.

## 2024-01-27 ENCOUNTER — Encounter: Payer: Self-pay | Admitting: Internal Medicine

## 2024-01-31 ENCOUNTER — Encounter

## 2024-03-29 ENCOUNTER — Other Ambulatory Visit: Payer: Self-pay | Admitting: Internal Medicine

## 2024-05-12 ENCOUNTER — Encounter: Payer: Self-pay | Admitting: *Deleted

## 2024-05-25 ENCOUNTER — Encounter
Admission: RE | Disposition: A | Discharge status: Home / Self Care | Payer: Self-pay | Source: Home / Self Care | Attending: Gastroenterology

## 2024-05-25 ENCOUNTER — Ambulatory Visit: Admitting: Anesthesiology

## 2024-05-25 ENCOUNTER — Ambulatory Visit
Admission: RE | Admit: 2024-05-25 | Discharge: 2024-05-25 | Disposition: A | Discharge status: Home / Self Care | Attending: Gastroenterology | Admitting: Gastroenterology

## 2024-05-25 DIAGNOSIS — K5730 Diverticulosis of large intestine without perforation or abscess without bleeding: Secondary | ICD-10-CM | POA: Diagnosis not present

## 2024-05-25 DIAGNOSIS — Z980 Intestinal bypass and anastomosis status: Secondary | ICD-10-CM | POA: Insufficient documentation

## 2024-05-25 DIAGNOSIS — E039 Hypothyroidism, unspecified: Secondary | ICD-10-CM | POA: Diagnosis not present

## 2024-05-25 DIAGNOSIS — Z9221 Personal history of antineoplastic chemotherapy: Secondary | ICD-10-CM | POA: Diagnosis not present

## 2024-05-25 DIAGNOSIS — K640 First degree hemorrhoids: Secondary | ICD-10-CM | POA: Insufficient documentation

## 2024-05-25 DIAGNOSIS — Z1211 Encounter for screening for malignant neoplasm of colon: Secondary | ICD-10-CM | POA: Insufficient documentation

## 2024-05-25 DIAGNOSIS — Z85038 Personal history of other malignant neoplasm of large intestine: Secondary | ICD-10-CM | POA: Diagnosis not present

## 2024-05-25 HISTORY — PX: COLONOSCOPY: SHX5424

## 2024-05-25 MED ORDER — PROPOFOL 500 MG/50ML IV EMUL
INTRAVENOUS | Status: DC | PRN
  Administered 2024-05-25: 90 mg via INTRAVENOUS
  Administered 2024-05-25: 125 ug/kg/min via INTRAVENOUS

## 2024-05-25 MED ORDER — SODIUM CHLORIDE 0.9 % IV SOLN: INTRAVENOUS | Status: DC

## 2024-05-25 MED ORDER — LIDOCAINE HCL (CARDIAC) PF 100 MG/5ML IV SOSY
PREFILLED_SYRINGE | INTRAVENOUS | Status: DC | PRN
  Administered 2024-05-25: 50 mg via INTRAVENOUS

## 2024-05-25 NOTE — Transfer of Care (Signed)
 Immediate Anesthesia Transfer of Care Note  Patient: Carolyn Bass  Procedure(s) Performed: COLONOSCOPY  Patient Location: Endoscopy Unit  Anesthesia Type:General  Level of Consciousness: awake, alert , and oriented  Airway & Oxygen Therapy: Patient Spontanous Breathing  Post-op Assessment: Report given to RN and Post -op Vital signs reviewed and stable  Post vital signs: Reviewed and stable  Last Vitals:  Vitals Value Taken Time  BP 111/73 05/25/24 11:27  Temp 36 C 05/25/24 11:27  Pulse 70 05/25/24 11:27  Resp 18 05/25/24 11:27  SpO2 100 % 05/25/24 11:27    Last Pain:  Vitals:   05/25/24 1127  TempSrc: Tympanic         Complications: No notable events documented.

## 2024-05-25 NOTE — H&P (Signed)
 Outpatient short stay form Pre-procedure 05/25/2024  [name], MD  Primary Physician: Marylynn Verneita CROME, MD  Reason for visit:  Surveillance  History of present illness:    69 y/o lady with history of hypothyroidism and colon cancer at the age of 59 here for surveillance. History of right hemi-colectomy. No blood thinners. No family history of GI malignancies.    Current Medications[1]  Medications Prior to Admission  Medication Sig Dispense Refill Last Dose/Taking   ARMOUR THYROID  30 MG tablet Take 1 tablet by mouth daily before breakfast. 90 tablet 0 05/24/2024   tiZANidine  (ZANAFLEX ) 2 MG tablet Take 1 tablet (2 mg total) by mouth every 6 (six) hours as needed for muscle spasms. 60 tablet 2 Past Month   ALPRAZolam  (XANAX ) 0.5 MG tablet Take 1 tablet (0.5 mg total) by mouth 2 (two) times daily as needed for anxiety. 30 tablet 0    cyanocobalamin  (VITAMIN B12) 1000 MCG/ML injection Inject 1,000 mcg into the muscle once. Take once a year      diphenoxylate -atropine  (LOMOTIL ) 2.5-0.025 MG tablet Take 1 tablet by mouth 4 (four) times daily as needed for diarrhea or loose stools. 60 tablet 5    Multiple Vitamins-Minerals (MULTIVITAMIN ADULTS 50+ PO) Take 1 tablet by mouth daily.        Allergies[2]   Past Medical History:  Diagnosis Date   Allergy    seasonal   Colon cancer (HCC) 1997   s/p resection, chemo   Colon polyp    DVT (deep venous thrombosis) (HCC)    2/2 BCP   Encounter for preventive health examination 12/30/2014   Eustachian tube dysfunction 07/03/2012   Hypertriglyceridemia without hypercholesterolemia    Hypothyroidism    On Armour. Did not tolerate synthroid   Prediabetes 02/29/2012   Thyroid  nodule 02/29/2012    Review of systems:  Otherwise negative.    Physical Exam  Gen: Alert, oriented. Appears stated age.  HEENT: PERRLA. Lungs: No respiratory distress CV: RRR Abd: soft, benign, no masses Ext: No edema    Planned procedures: Proceed  with colonoscopy. The patient understands the nature of the planned procedure, indications, risks, alternatives and potential complications including but not limited to bleeding, infection, perforation, damage to internal organs and possible oversedation/side effects from anesthesia. The patient agrees and gives consent to proceed.  Please refer to procedure notes for findings, recommendations and patient disposition/instructions.     [name], MD [facility]         [1]  Current Facility-Administered Medications:    0.9 %  sodium chloride  infusion, , Intravenous, Continuous, Miki Labuda, Ole ONEIDA, MD, Last Rate: 20 mL/hr at 05/25/24 1029, Restarted at 05/25/24 1055 [2]  Allergies Allergen Reactions   Synthroid [Levothyroxine Sodium]     Brittle nails, fatigue, dry skin   Penicillins Rash   Tetracyclines & Related Rash

## 2024-05-25 NOTE — Anesthesia Postprocedure Evaluation (Signed)
"   Anesthesia Post Note  Patient: Carolyn Bass  Procedure(s) Performed: COLONOSCOPY  Patient location during evaluation: PACU Anesthesia Type: General Level of consciousness: awake Pain management: satisfactory to patient Vital Signs Assessment: post-procedure vital signs reviewed and stable Respiratory status: spontaneous breathing Cardiovascular status: stable Anesthetic complications: no   No notable events documented.   Last Vitals:  Vitals:   05/25/24 1136 05/25/24 1144  BP: 128/81 132/78  Pulse: 68 71  Resp: 13 13  Temp:    SpO2: 97% 98%    Last Pain:  Vitals:   05/25/24 1144  TempSrc:   PainSc: 0-No pain                 VAN STAVEREN,Kearia Yin      "

## 2024-05-25 NOTE — Interval H&P Note (Signed)
 History and Physical Interval Note:  05/25/2024 11:05 AM  Carolyn Bass  has presented today for surgery, with the diagnosis of Personal history of other malignant neoplasm of large intestine (Z85.038).  The various methods of treatment have been discussed with the patient and family. After consideration of risks, benefits and other options for treatment, the patient has consented to  Procedures: COLONOSCOPY (N/A) as a surgical intervention.  The patient's history has been reviewed, patient examined, no change in status, stable for surgery.  I have reviewed the patient's chart and labs.  Questions were answered to the patient's satisfaction.     [name]  Ok to proceed with colonoscopy

## 2024-05-25 NOTE — Anesthesia Procedure Notes (Signed)
 Date/Time: 05/25/2024 11:06 AM  Performed by: Dominica Krabbe, CRNAPre-anesthesia Checklist: Patient identified, Emergency Drugs available, Suction available, Patient being monitored and Timeout performed Patient Re-evaluated:Patient Re-evaluated prior to induction Oxygen Delivery Method: Nasal cannula Preoxygenation: Pre-oxygenation with 100% oxygen Induction Type: IV induction

## 2024-05-25 NOTE — Anesthesia Preprocedure Evaluation (Signed)
"                                    Anesthesia Evaluation  Patient identified by MRN, date of birth, ID band Patient awake    Reviewed: Allergy & Precautions, NPO status , Patient's Chart, lab work & pertinent test results  Airway Mallampati: II  TM Distance: >3 FB Neck ROM: Full    Dental  (+) Teeth Intact   Pulmonary neg pulmonary ROS   Pulmonary exam normal breath sounds clear to auscultation       Cardiovascular Exercise Tolerance: Good negative cardio ROS Normal cardiovascular exam Rhythm:Regular Rate:Normal     Neuro/Psych negative neurological ROS  negative psych ROS   GI/Hepatic negative GI ROS, Neg liver ROS,,,  Endo/Other  negative endocrine ROSHypothyroidism    Renal/GU negative Renal ROS  negative genitourinary   Musculoskeletal negative musculoskeletal ROS (+)    Abdominal Normal abdominal exam  (+)   Peds negative pediatric ROS (+)  Hematology negative hematology ROS (+)   Anesthesia Other Findings Past Medical History: No date: Allergy     Comment:  seasonal 1997: Colon cancer (HCC)     Comment:  s/p resection, chemo No date: Colon polyp No date: DVT (deep venous thrombosis) (HCC)     Comment:  2/2 BCP 12/30/2014: Encounter for preventive health examination 07/03/2012: Eustachian tube dysfunction No date: Hypertriglyceridemia without hypercholesterolemia No date: Hypothyroidism     Comment:  On Armour. Did not tolerate synthroid 02/29/2012: Prediabetes 02/29/2012: Thyroid  nodule  Past Surgical History: 1997: APPENDECTOMY     Comment:  With colon resection 1997: COLON RESECTION; Right 1997: COLON SURGERY     Comment:  Colon resection 02/26/2018: COLONOSCOPY WITH PROPOFOL ; N/A     Comment:  Procedure: COLONOSCOPY WITH PROPOFOL ;  Surgeon: Dessa Reyes ORN, MD;  Location: [facility] ENDOSCOPY;  Service:               Endoscopy;  Laterality: N/A; 2002: HERNIA REPAIR 1990: KNEE ARTHROSCOPY W/ ACL RECONSTRUCTION;  Left 04/2022: lipoma removed from left side 1997: SMALL INTESTINE SURGERY     Comment:  with colon resection 1962: TONSILLECTOMY AND ADENOIDECTOMY 2001: TUBAL LIGATION  BMI    Body Mass Index: 26.61 kg/m      Reproductive/Obstetrics negative OB ROS                              Anesthesia Physical Anesthesia Plan  ASA: 2  Anesthesia Plan: General   Post-op Pain Management:    Induction: Intravenous  PONV Risk Score and Plan: Propofol  infusion and TIVA  Airway Management Planned: Natural Airway and Nasal Cannula  Additional Equipment:   Intra-op Plan:   Post-operative Plan:   Informed Consent: I have reviewed the patients History and Physical, chart, labs and discussed the procedure including the risks, benefits and alternatives for the proposed anesthesia with the patient or authorized representative who has indicated his/her understanding and acceptance.     Dental Advisory Given  Plan Discussed with: CRNA  Anesthesia Plan Comments:         Anesthesia Quick Evaluation  "

## 2024-05-25 NOTE — Op Note (Signed)
 [facility] Patient Name: Carolyn Bass Procedure Date: 05/25/2024 11:02 AM MRN: [id number] Account #: [id number] Date of Birth: 1955/08/27 Admit Type: Outpatient Age: 69 Room: [facility] ENDO ROOM 3 Gender: Female Note Status: Finalized Instrument Name: Colon Scope [id number] Procedure:             Colonoscopy Indications:           High risk colon cancer surveillance: Personal history                         of colon cancer Providers:             Ole Schick MD, MD Referring MD:          Verneita Kettering, MD (Referring MD) Medicines:             Monitored Anesthesia Care Complications:         No immediate complications. Procedure:             Pre-Anesthesia Assessment:                        - Prior to the procedure, a History and Physical was                         performed, and patient medications and allergies were                         reviewed. The patient is competent. The risks and                         benefits of the procedure and the sedation options and                         risks were discussed with the patient. All questions                         were answered and informed consent was obtained.                         Patient identification and proposed procedure were                         verified by the physician, the nurse, the                         anesthesiologist, the anesthetist and the technician                         in the endoscopy suite. Mental Status Examination:                         alert and oriented. Airway Examination: normal                         oropharyngeal airway and neck mobility. Respiratory                         Examination: clear to auscultation. CV Examination:  normal. Prophylactic Antibiotics: The patient does not                         require prophylactic antibiotics. Prior                         Anticoagulants: The patient has taken no anticoagulant                          or antiplatelet agents. ASA Grade Assessment: II - A                         patient with mild systemic disease. After reviewing                         the risks and benefits, the patient was deemed in                         satisfactory condition to undergo the procedure. The                         anesthesia plan was to use monitored anesthesia care                         (MAC). Immediately prior to administration of                         medications, the patient was re-assessed for adequacy                         to receive sedatives. The heart rate, respiratory                         rate, oxygen saturations, blood pressure, adequacy of                         pulmonary ventilation, and response to care were                         monitored throughout the procedure. The physical                         status of the patient was re-assessed after the                         procedure.                        After obtaining informed consent, the colonoscope was                         passed under direct vision. Throughout the procedure,                         the patient's blood pressure, pulse, and oxygen                         saturations were monitored continuously. The  Colonoscope was introduced through the anus and                         advanced to the the ileocolonic anastomosis. The                         colonoscopy was performed without difficulty. The                         patient tolerated the procedure well. The quality of                         the bowel preparation was good. The terminal ileum and                         the rectum were photographed. Findings:      The perianal and digital rectal examinations were normal.      There was evidence of a patent end-to-end ileo-colonic anastomosis found       in the terminal ileum. This was characterized by healthy appearing       mucosa. This was traversed.      The  neo-terminal ileum appeared normal.      Scattered small-mouthed diverticula were found in the sigmoid colon and       descending colon.      Internal hemorrhoids were found during retroflexion. The hemorrhoids       were Grade I (internal hemorrhoids that do not prolapse).      The exam was otherwise without abnormality on direct and retroflexion       views. Impression:            - Patent end-to-end ileo-colonic anastomosis,                         characterized by healthy appearing mucosa.                        - The examined portion of the ileum was normal.                        - Diverticulosis in the sigmoid colon and in the                         descending colon.                        - Internal hemorrhoids.                        - The examination was otherwise normal on direct and                         retroflexion views.                        - No specimens collected. Recommendation:        - Discharge patient to home.                        - Resume previous diet.                        -  Continue present medications.                        - Repeat colonoscopy in 5 years for surveillance.                        - Return to referring physician as previously                         scheduled. Procedure Code(s):     --- Professional ---                        H9894, Colorectal cancer screening; colonoscopy on                         individual at high risk Diagnosis Code(s):     --- Professional ---                        [phone], Personal history of other malignant neoplasm                         of large intestine                        Z98.0, Intestinal bypass and anastomosis status                        K64.0, First degree hemorrhoids                        K57.30, Diverticulosis of large intestine without                         perforation or abscess without bleeding CPT copyright 2022 [employer]. All rights reserved. The codes documented  in this report are preliminary and upon coder review may  be revised to meet current compliance requirements. Ole Schick MD, MD 05/25/2024 11:39:38 AM Number of Addenda: 0 Note Initiated On: 05/25/2024 11:02 AM Scope Withdrawal Time: 0 hours 6 minutes 45 seconds  Total Procedure Duration: 0 hours 11 minutes 58 seconds  Estimated Blood Loss:  Estimated blood loss: none.      [facility]

## 2024-05-31 ENCOUNTER — Other Ambulatory Visit: Payer: Self-pay | Admitting: Internal Medicine

## 2024-05-31 DIAGNOSIS — M25561 Pain in right knee: Secondary | ICD-10-CM

## 2024-05-31 MED ORDER — PREDNISONE 10 MG PO TABS: ORAL_TABLET | ORAL | 0 refills | Status: AC

## 2024-05-31 MED ORDER — OSELTAMIVIR PHOSPHATE 75 MG PO CAPS: 75.0000 mg | ORAL_CAPSULE | Freq: Two times a day (BID) | ORAL | 0 refills | Status: AC

## 2024-06-01 ENCOUNTER — Encounter: Payer: Self-pay | Admitting: Internal Medicine

## 2024-06-01 DIAGNOSIS — M25561 Pain in right knee: Secondary | ICD-10-CM | POA: Insufficient documentation

## 2024-06-07 ENCOUNTER — Other Ambulatory Visit: Payer: Self-pay | Admitting: Internal Medicine

## 2024-06-23 ENCOUNTER — Other Ambulatory Visit: Payer: Self-pay

## 2024-06-23 ENCOUNTER — Ambulatory Visit

## 2024-06-23 DIAGNOSIS — M25661 Stiffness of right knee, not elsewhere classified: Secondary | ICD-10-CM

## 2024-06-23 DIAGNOSIS — M25561 Pain in right knee: Secondary | ICD-10-CM | POA: Diagnosis not present
